# Patient Record
Sex: Female | Born: 1985 | Race: Black or African American | Hispanic: No | Marital: Married | Smoking: Former smoker
Health system: Southern US, Community
[De-identification: ages and names within clinical notes are randomized; demographics above are authoritative.]

## PROBLEM LIST (undated history)

## (undated) ENCOUNTER — Inpatient Hospital Stay (HOSPITAL_COMMUNITY): Payer: Self-pay

## (undated) DIAGNOSIS — J020 Streptococcal pharyngitis: Secondary | ICD-10-CM

## (undated) DIAGNOSIS — R110 Nausea: Secondary | ICD-10-CM

## (undated) DIAGNOSIS — D649 Anemia, unspecified: Secondary | ICD-10-CM

## (undated) DIAGNOSIS — J039 Acute tonsillitis, unspecified: Secondary | ICD-10-CM

## (undated) DIAGNOSIS — Z332 Encounter for elective termination of pregnancy: Secondary | ICD-10-CM

## (undated) HISTORY — DX: Anemia, unspecified: D64.9

## (undated) HISTORY — DX: Nausea: R11.0

## (undated) HISTORY — DX: Streptococcal pharyngitis: J02.0

## (undated) HISTORY — PX: MOUTH SURGERY: SHX715

## (undated) HISTORY — DX: Acute tonsillitis, unspecified: J03.90

## (undated) HISTORY — DX: Encounter for elective termination of pregnancy: Z33.2

---
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
## 2005-02-13 ENCOUNTER — Ambulatory Visit: Payer: Self-pay | Admitting: Family Medicine

## 2005-06-05 ENCOUNTER — Ambulatory Visit: Payer: Self-pay | Admitting: Family Medicine

## 2005-08-18 ENCOUNTER — Other Ambulatory Visit: Admission: RE | Admit: 2005-08-18 | Discharge: 2005-08-18 | Payer: Self-pay | Admitting: Family Medicine

## 2005-08-18 ENCOUNTER — Ambulatory Visit: Payer: Self-pay | Admitting: Family Medicine

## 2009-03-06 ENCOUNTER — Encounter: Payer: Self-pay | Admitting: Family Medicine

## 2009-03-17 DIAGNOSIS — J039 Acute tonsillitis, unspecified: Secondary | ICD-10-CM

## 2009-03-17 HISTORY — DX: Acute tonsillitis, unspecified: J03.90

## 2009-10-02 ENCOUNTER — Encounter: Payer: Self-pay | Admitting: Family Medicine

## 2009-11-20 ENCOUNTER — Ambulatory Visit: Payer: Self-pay | Admitting: Family Medicine

## 2009-11-20 LAB — CONVERTED CEMR LAB: Beta hcg, urine, semiquantitative: NEGATIVE

## 2009-11-21 ENCOUNTER — Telehealth: Payer: Self-pay | Admitting: Family Medicine

## 2009-11-21 LAB — CONVERTED CEMR LAB
BUN: 9 mg/dL (ref 6–23)
CO2: 27 meq/L (ref 19–32)
Chloride: 106 meq/L (ref 96–112)
Creatinine, Ser: 0.62 mg/dL (ref 0.40–1.20)
HDL: 43 mg/dL (ref >39)
Hemoglobin: 13.5 g/dL (ref 12.0–15.0)
LDL Cholesterol: 85 mg/dL (ref 0–99)
Lymphocytes Relative: 22 % (ref 12–46)
Monocytes Absolute: 0.9 10*3/uL (ref 0.1–1.0)
Monocytes Relative: 6 % (ref 3–12)
Neutro Abs: 10.9 10*3/uL — ABNORMAL HIGH (ref 1.7–7.7)
RBC: 4.46 M/uL (ref 3.87–5.11)
TSH: 0.388 microintl units/mL (ref 0.350–4.500)
VLDL: 15 mg/dL (ref 0–40)
WBC: 15.3 10*3/uL — ABNORMAL HIGH (ref 4.0–10.5)

## 2010-01-08 ENCOUNTER — Ambulatory Visit: Payer: Self-pay | Admitting: Family Medicine

## 2010-01-08 ENCOUNTER — Other Ambulatory Visit: Admission: RE | Admit: 2010-01-08 | Discharge: 2010-01-08 | Payer: Self-pay | Admitting: Family Medicine

## 2010-01-08 DIAGNOSIS — N760 Acute vaginitis: Secondary | ICD-10-CM | POA: Insufficient documentation

## 2010-01-08 LAB — HM PAP SMEAR

## 2010-01-09 LAB — CONVERTED CEMR LAB
Basophils Relative: 0 % (ref 0–1)
MCHC: 33.4 g/dL (ref 30.0–36.0)
Monocytes Relative: 7 % (ref 3–12)
Neutro Abs: 11.1 10*3/uL — ABNORMAL HIGH (ref 1.7–7.7)
Neutrophils Relative %: 66 % (ref 43–77)
Platelets: 297 10*3/uL (ref 150–400)
RBC: 4.21 M/uL (ref 3.87–5.11)
WBC: 16.8 10*3/uL — ABNORMAL HIGH (ref 4.0–10.5)

## 2010-01-10 ENCOUNTER — Encounter: Payer: Self-pay | Admitting: Family Medicine

## 2010-01-11 ENCOUNTER — Ambulatory Visit (HOSPITAL_COMMUNITY)
Admission: RE | Admit: 2010-01-11 | Discharge: 2010-01-11 | Payer: Self-pay | Source: Home / Self Care | Admitting: Family Medicine

## 2010-01-11 ENCOUNTER — Encounter: Payer: Self-pay | Admitting: Physician Assistant

## 2010-01-11 LAB — CONVERTED CEMR LAB
Candida species: NEGATIVE
Chlamydia, DNA Probe: POSITIVE — AB
Gardnerella vaginalis: POSITIVE — AB

## 2010-01-18 ENCOUNTER — Telehealth: Payer: Self-pay | Admitting: Family Medicine

## 2010-02-01 ENCOUNTER — Encounter: Payer: Self-pay | Admitting: Family Medicine

## 2010-02-03 ENCOUNTER — Encounter: Payer: Self-pay | Admitting: Family Medicine

## 2010-02-04 ENCOUNTER — Encounter: Payer: Self-pay | Admitting: Family Medicine

## 2010-02-05 LAB — CONVERTED CEMR LAB
Basophils Absolute: 0.1 10*3/uL (ref 0.0–0.1)
Basophils Relative: 0 % (ref 0–1)
Eosinophils Absolute: 0.3 10*3/uL (ref 0.0–0.7)
Eosinophils Relative: 2 % (ref 0–5)
HCT: 39.7 % (ref 36.0–46.0)
MCHC: 34.8 g/dL (ref 30.0–36.0)
MCV: 88.8 fL (ref 78.0–100.0)
Neutrophils Relative %: 61 % (ref 43–77)
Platelets: 317 10*3/uL (ref 150–400)
RDW: 13.8 % (ref 11.5–15.5)
WBC: 16.7 10*3/uL — ABNORMAL HIGH (ref 4.0–10.5)

## 2010-02-25 ENCOUNTER — Encounter: Payer: Self-pay | Admitting: Family Medicine

## 2010-03-01 ENCOUNTER — Ambulatory Visit (HOSPITAL_COMMUNITY): Payer: Self-pay | Admitting: Oncology

## 2010-03-01 ENCOUNTER — Encounter (HOSPITAL_COMMUNITY)
Admission: RE | Admit: 2010-03-01 | Discharge: 2010-03-31 | Payer: Self-pay | Source: Home / Self Care | Attending: Oncology | Admitting: Oncology

## 2010-03-01 ENCOUNTER — Encounter: Payer: Self-pay | Admitting: Family Medicine

## 2010-03-04 ENCOUNTER — Ambulatory Visit (HOSPITAL_COMMUNITY): Payer: Self-pay | Admitting: Oncology

## 2010-03-05 ENCOUNTER — Encounter (HOSPITAL_COMMUNITY)
Admission: RE | Admit: 2010-03-05 | Discharge: 2010-04-04 | Payer: Self-pay | Source: Home / Self Care | Attending: Oncology | Admitting: Oncology

## 2010-03-13 ENCOUNTER — Encounter: Payer: Self-pay | Admitting: Family Medicine

## 2010-04-16 NOTE — Miscellaneous (Signed)
  Clinical Lists Changes  Orders: Added new Referral order of Hematology Referral (Hematology) - Signed

## 2010-04-16 NOTE — Progress Notes (Signed)
Summary: dr.teoh  dr.teoh   Imported By: Lind Guest 10/09/2009 14:09:58  _____________________________________________________________________  External Attachment:    Type:   Image     Comment:   External Document

## 2010-04-16 NOTE — Letter (Signed)
Summary: TO DR Jerelyn Scott  TO DR Jerelyn Scott   Imported By: Lind Guest 02/05/2010 08:29:31  _____________________________________________________________________  External Attachment:    Type:   Image     Comment:   External Document

## 2010-04-16 NOTE — Letter (Signed)
Summary: Laboratory/X-Ray Results  [facility]  [address]   [place], [place] [address]   Phone: [phone]  Fax: [phone]    Lab/X-Ray Results  January 11, 2010  MRN: [id number]  [facility] [address] [place], [place]  [address]    The results of your recent lab/x-ray has been reviewed and were found: ABNORMAL Please contact our office upon receiving this notification.         Adella Hare LPN

## 2010-04-16 NOTE — Assessment & Plan Note (Signed)
Summary: physical   Vital Signs:  Patient profile:   25 year old female Menstrual status:  irregular Height:      63.5 inches Weight:      141 pounds BMI:     24.67 Pulse rate:   84 / minute Pulse rhythm:   irregular Resp:     16 per minute BP sitting:   108 / 58  (left arm)  Vitals Entered By: Mauricia Area CMA (January 08, 2010 3:28 PM) CC: physical   CC:  physical.  History of Present Illness: Localised left lower quad pain x 2weeks. Excessively heavy bleeding last  week, flooding and clotting, first time ever, needs ocp, bleeding has been irregular  i recentr times Reports  that she has otherwise been  doing well. Denies recent fever or chills. Denies sinus pressure, nasal congestion , ear pain or sore throat. Denies chest congestion, or cough productive of sputum. Denies chest pain, palpitations, PND, orthopnea or leg swelling. Denies abdominal pain, nausea, vomitting, diarrhea or constipation. Denies change in bowel movements or bloody stool. Denies dysuria , frequency, incontinence or hesitancy. Denies  joint pain, swelling, or reduced mobility. Denies headaches, vertigo, seizures. Denies depression, anxiety or insomnia. Denies  rash, lesions, or itch.     Current Medications (verified): 1)  None  Allergies (verified): No Known Drug Allergies  Review of Systems      See HPI General:  Complains of fatigue. Eyes:  Denies blurring and discharge. GU:  Complains of abnormal vaginal bleeding. Endo:  Denies cold intolerance, excessive thirst, excessive urination, and heat intolerance. Heme:  Complains of bleeding; denies abnormal bruising. Allergy:  Denies hives or rash and itching eyes.  Physical Exam  General:  Well-developed,well-nourished,in no acute distress; alert,appropriate and cooperative throughout examination Head:  Normocephalic and atraumatic without obvious abnormalities. No apparent alopecia or balding. Eyes:  No corneal or conjunctival  inflammation noted. EOMI. Perrla. Funduscopic exam benign, without hemorrhages, exudates or papilledema. Vision grossly normal. Ears:  External ear exam shows no significant lesions or deformities.  Otoscopic examination reveals clear canals, tympanic membranes are intact bilaterally without bulging, retraction, inflammation or discharge. Hearing is grossly normal bilaterally. Nose:  External nasal examination shows no deformity or inflammation. Nasal mucosa are pink and moist without lesions or exudates. Mouth:  Oral mucosa and oropharynx without lesions or exudates.  Teeth in good repair. Neck:  No deformities, masses, or tenderness noted. Chest Wall:  No deformities, masses, or tenderness noted. Breasts:  No mass, nodules, thickening, tenderness, bulging, retraction, inflamation, nipple discharge or skin changes noted.   Lungs:  Normal respiratory effort, chest expands symmetrically. Lungs are clear to auscultation, no crackles or wheezes. Heart:  Normal rate and regular rhythm. S1 and S2 normal without gallop, murmur, click, rub or other extra sounds. Abdomen:  Bowel sounds positive,abdomen soft and non-tender without masses, organomegaly or hernias noted. Genitalia:  Normal introitus for age, no external lesions,malodoros vaginal discharge, mucosa pink and moist, no vaginal or cervical lesions, no vaginal atrophy, no friaility or hemorrhage, normal uterus size and positionleft adnexal mass and tenderness Msk:  No deformity or scoliosis noted of thoracic or lumbar spine.   Pulses:  R and L carotid,radial,femoral,dorsalis pedis and posterior tibial pulses are full and equal bilaterally Extremities:  No clubbing, cyanosis, edema, or deformity noted with normal full range of motion of all joints.   Neurologic:  No cranial nerve deficits noted. Station and gait are normal. Plantar reflexes are down-going bilaterally. DTRs are symmetrical throughout. Sensory,  motor and coordinative functions appear  intact. Skin:  Intact without suspicious lesions or rashes Cervical Nodes:  No lymphadenopathy noted Axillary Nodes:  No palpable lymphadenopathy Inguinal Nodes:  No significant adenopathy Psych:  Cognition and judgment appear intact. Alert and cooperative with normal attention span and concentration. No apparent delusions, illusions, hallucinations   Impression & Recommendations:  Problem # 1:  PELVIC MASS (ICD-789.30) Assessment Comment Only  Orders: Radiology Referral (Radiology)  Problem # 2:  MENORRHAGIA (ICD-626.2) Assessment: Comment Only  The following medications were removed from the medication list:    Provera 5 Mg Tabs (Medroxyprogesterone acetate) .Marland Kitchen... Take 1 tablet by mouth two times a day , start 11/21/2009    Ortho Tri-cyclen (28) 0.18/0.215/0.25 Mg-35 Mcg Tabs (Norgestim-eth estrad triphasic) ..... Use as directed , start on the 5th dayafter you start bleeding Her updated medication list for this problem includes:    Ortho Tri-cyclen (28) 0.18/0.215/0.25 Mg-35 Mcg Tabs (Norgestim-eth estrad triphasic) .Marland Kitchen... 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 Take 1 tablet by mouth three times a day Her updated medication list for this problem includes:    Doxycycline Hyclate 100 Mg Caps (Doxycycline hyclate) .Marland Kitchen... Take 1 capsule by mouth two times a day    Metronidazole 500 Mg Tabs (Metronidazole) .Marland Kitchen... Take 1 tablet by mouth two times a day  Orders: T-Wet Prep by Molecular Probe [phone]) T-Chlamydia & GC Probe, Genital ([id number]/[id number]) T-Pap Smear, Thin Prep (78469)  Problem # 5:  AMENORRHEA  (ICD-626.0) Assessment: Comment Only  The following medications were removed from the medication list:    Provera 5 Mg Tabs (Medroxyprogesterone acetate) .Marland Kitchen... Take 1 tablet by mouth two times a day , start 11/21/2009    Ortho Tri-cyclen (28) 0.18/0.215/0.25 Mg-35 Mcg Tabs (Norgestim-eth estrad triphasic) ..... Use as directed , start on the 5th dayafter you start bleeding Her updated medication list for this problem includes:    Ortho Tri-cyclen (28) 0.18/0.215/0.25 Mg-35 Mcg Tabs (Norgestim-eth estrad triphasic) .Marland Kitchen... Take one tablet every day  Orders: Urine Pregnancy Test  (62952)  negative  Complete Medication List: 1)  Ortho Tri-cyclen (28) 0.18/0.215/0.25 Mg-35 Mcg Tabs (Norgestim-eth estrad triphasic) .... Take one tablet every day 2)  Doxycycline Hyclate 100 Mg Caps (Doxycycline hyclate) .... Take 1 capsule by mouth two times a day 3)  Metronidazole 500 Mg Tabs (Metronidazole) .... Take 1 tablet by mouth two times a day  Other Orders: T-CBC w/Diff ([id number])  Patient Instructions: 1)  Please schedule a follow-up appointment in 4 months. 2)  It is important that you exercise regularly at least 20 minutes 5 times a week. If you develop chest pain, have severe difficulty breathing, or feel very tired , stop exercising immediately and seek medical attention. 3)  CBC today, re eval leukocytosis. 4)  Pelvoc [place] to eval heavy menses and pelvic pain.Marland Kitchen 5)  If your WBC is still high you will be referred tohematology, Dr Jerelyn Scott 6)  For the birth control pills, pls take 2 tablets today and 2 tomorrow, then start one dailyb after that, usecondoms this month as well. 7)  We will arrange a pelvic US asap. and also do a urine pregnanacy test Prescriptions:  METRONIDAZOLE 500 MG TABS (METRONIDAZOLE) Take 1 tablet by mouth two times a day  #14 x 0   Entered and Authorized by:   Syliva Overman MD   Signed by:   Syliva Overman MD on 01/11/2010   Method used:   Historical   RxID:    [id number] DOXYCYCLINE HYCLATE 100 MG CAPS (DOXYCYCLINE HYCLATE) Take 1 capsule by mouth two times a day  #14 x 0   Entered and Authorized by:   Syliva Overman MD   Signed by:   Syliva Overman MD on 01/11/2010   Method used:   Historical   RxID:   [id number] ORTHO TRI-CYCLEN (28) 0.18/0.215/0.25 MG-35 MCG TABS (NORGESTIM-ETH ESTRAD TRIPHASIC) take one tablet every day  #1 x 11   Entered and Authorized by:   Syliva Overman MD   Signed by:   Syliva Overman MD on 01/08/2010   Method used:   Print then Give to Patient   RxID:   [id number]    Orders Added: 1)  Est. Patient 18-39 years [99395] 2)  T-CBC w/Diff [[id number]] 3)  Radiology Referral [Radiology] 4)  Radiology Referral [Radiology] 5)  T-Wet Prep by Molecular Probe [[id number]] 6)  T-Chlamydia & GC Probe, Genital [87491/[id number]] 7)  T-Pap Smear, Thin Prep [56387] 8)  Urine Pregnancy Test  [81025]

## 2010-04-16 NOTE — Letter (Signed)
Summary: Letter  Letter   Imported By: Lind Guest 02/01/2010 14:49:07  _____________________________________________________________________  External Attachment:    Type:   Image     Comment:   External Document

## 2010-04-16 NOTE — Assessment & Plan Note (Signed)
Summary: f up   Vital Signs:  Patient profile:   25 year old female Menstrual status:  irregular LMP:     08/15/2009 Height:      63.5 inches Weight:      140 pounds BMI:     24.50 Pulse rate:   74 / minute Pulse rhythm:   regular Resp:     16 per minute BP sitting:   122 / 82  (left arm) Cuff size:   regular  Vitals Entered By: Everitt Amber LPN (November 20, 2009 3:28 PM) CC: New Patient (been here before but its been awhile) LMP (date): 08/15/2009     Menstrual Status irregular Enter LMP: 08/15/2009   CC:  New Patient (been here before but its been awhile).  History of Present Illness: Reports  that she has been doing well. Her main concern is whethter she may be pregnant. She has not had a regular bleed for over 6 weeks , is on no contraception, wants to keep baby. Denies recent fever or chills. Denies sinus pressure, nasal congestion , ear pain or sore throat. Denies chest congestion, or cough productive of sputum. Denies chest pain, palpitations, PND, orthopnea or leg swelling. Denies abdominal pain, nausea, vomitting, diarrhea or constipation. Denies change in bowel movements or bloody stool. Denies dysuria , frequency, incontinence or hesitancy. Denies  joint pain, swelling, or reduced mobility. Denies headaches, vertigo, seizures. Denies depression, anxiety or insomnia. Denies  rash, lesions, or itch.     Preventive Screening-Counseling & Management  Alcohol-Tobacco     Smoking Status: never  Caffeine-Diet-Exercise     Does Patient Exercise: no  Comments: occasionally      Drug Use:  no.    Current Medications (verified): 1)  None  Allergies (verified): No Known Drug Allergies  Past History:  Past Medical History: healthy and on no meds elective aboortion at 18 weeks in 2004 recurrent strep throat in the pasyt year approx 3 times mild nausea in the past several weeks hospitalised overnight in [place] in 2011 for severe tonsillitis  Past  Surgical History: abortion in 18 weeks in 2004  tonsillectomy to be scheduled in the near future  Family History: Mother-living-Bone marrow cancer father-unknown only child  Social History: Occupation: Medical sales representative Single Alcohol use-yes occasionally Drug use-no Regular exercise-no Never Smoked Smoking Status:  never Drug Use:  no Does Patient Exercise:  no  Review of Systems      See HPI General:  Complains of fatigue. Eyes:  Denies blurring and discharge. GU:  Complains of abnormal vaginal bleeding; spotting since June. Endo:  Denies cold intolerance, excessive thirst, and excessive urination. Heme:  Denies abnormal bruising and bleeding. Allergy:  Denies hives or rash and itching eyes.  Physical Exam  General:  Well-developed,well-nourished,in no acute distress; alert,appropriate and cooperative throughout examination HEENT: No facial asymmetry,  EOMI, No sinus tenderness, TM's Clear, oropharynx  pink and moist.   Chest: Clear to auscultation bilaterally.  CVS: S1, S2, No murmurs, No S3.   Abd: Soft, Nontender.  MS: Adequate ROM spine, hips, shoulders and knees.  Ext: No edema.   CNS: CN 2-12 intact, power tone and sensation normal throughout.   Skin: Intact, no visible lesions or rashes.  Psych: Good eye contact, normal affect.  Memory intact, not anxious or depressed appearing.    Impression & Recommendations:  Problem # 1:  FATIGUE (ICD-780.79) Assessment Comment Only  Orders: T-Basic Metabolic Panel [phone]) T-TSH [phone]) T-CBC w/Diff [phone])  Problem # 2:  AMENORRHEA (ICD-626.0) Assessment: Comment Only  Her updated medication list for this problem includes:    Provera 5 Mg Tabs (Medroxyprogesterone acetate) .Marland Kitchen... Take 1 tablet by mouth two times a day , start 11/21/2009    Ortho Tri-cyclen (28) 0.18/0.215/0.25 Mg-35 Mcg Tabs (Norgestim-eth estrad triphasic) ..... Use as directed , start on the 5th dayafter you start  bleeding  Orders: Urine Pregnancy Test  (81025)negative B-HCG Quant-[facility] ([id number])  Complete Medication List: 1)  Provera 5 Mg Tabs (Medroxyprogesterone acetate) .... Take 1 tablet by mouth two times a day , start 11/21/2009 2)  Ortho Tri-cyclen (28) 0.18/0.215/0.25 Mg-35 Mcg Tabs (Norgestim-eth estrad triphasic) .... Use as directed , start on the 5th dayafter you start bleeding 3)  Penicillin V Potassium 500 Mg Tabs (Penicillin v potassium) .... Take 1 tablet by mouth three times a day  Other Orders: T-Lipid Profile ([id number]) Tdap => 71yrs IM (29562) Admin 1st Vaccine (13086) Admin 1st Vaccine [facility]) [phone])  Patient Instructions: 1)  cPE in 6 to 7 weeks. 2)  BMP prior to visit, ICD-9: 3)  Lipid Panel prior to visit, ICD-9: 4)  TSH prior to visit, ICD-9:   today 5)  CBC w/ Diff prior to visit, ICD-9: 6)  Quaant HCG 7)  TdAP today. 8)  PLS call tomorrow for your blood test result, if you are not pregnant i will send in meds to start a bleed, to be followed by contraceptive pills the 4th day of your bleed. 9)  If you could be exposed to sexually transmitted diseases, you should use a condom. 10)  If you are having sex and you or your partner don't want a child, use contraception. Prescriptions: PROVERA 5 MG TABS (MEDROXYPROGESTERONE ACETATE) Take 1 tablet by mouth two times a day , start 11/21/2009  #10 x 0   Entered by:   Everitt Amber LPN   Authorized by:   Syliva Overman MD   Signed by:   Everitt Amber LPN on 62/95/2841   Method used:   Printed then faxed to ...         RxID:   [id number] PENICILLIN V POTASSIUM 500 MG TABS (PENICILLIN V POTASSIUM) Take 1 tablet by mouth three times a day  #21 x 0   Entered and Authorized by:   Syliva Overman MD   Signed by:   Syliva Overman MD on 11/21/2009   Method used:   Printed then faxed to ...         RxID:   [id number] ORTHO TRI-CYCLEN (28) 0.18/0.215/0.25 MG-35 MCG TABS (NORGESTIM-ETH ESTRAD TRIPHASIC) Use as  directed , start on the 5th dayafter you start bleeding  #1 x 1   Entered and Authorized by:   Syliva Overman MD   Signed by:   Syliva Overman MD on 11/21/2009   Method used:   Printed then faxed to ...         RxID:   [id number] PROVERA 5 MG TABS (MEDROXYPROGESTERONE ACETATE) Take 1 tablet by mouth two times a day , start 11/21/2009  #10 x 0   Entered and Authorized by:   Syliva Overman MD   Signed by:   Syliva Overman MD on 11/21/2009   Method used:   Printed then faxed to ...         RxID:   [id number]    Laboratory Results   Urine Tests      Urine HCG: negative      Tetanus/Td Vaccine    Vaccine Type: Tdap  Site: left deltoid    Mfr: boostrix    Dose: 0.5 ml    Route: IM    Given by: Everitt Amber LPN    Exp. Date: 07/2010    Lot #: [id number]

## 2010-04-16 NOTE — Progress Notes (Signed)
  Phone Note Call from Patient   Caller: Patient Summary of Call: patient states she is concerned the abx are gonna work because she is throwing them up, states she takes with food and water but still gets sick and cant hold them down. metrondazole Initial call taken by: Adella Hare LPN,  January 18, 2010 2:10 PM  Follow-up for Phone Call        [facility] ERX AND ADVISE PHENERGAN 12.5MG  .BIDTAB as needed #12 ONLY Follow-up by: Syliva Overman MD,  January 18, 2010 2:53 PM  Additional Follow-up for Phone Call Additional follow up Details #1::        Phone Call Completed, Rx Called In Additional Follow-up by: Adella Hare LPN,  January 18, 2010 3:24 PM    New/Updated Medications: PROMETHAZINE HCL 12.5 MG TABS (PROMETHAZINE HCL) one tab by mouth two times a day as needed Prescriptions: PROMETHAZINE HCL 12.5 MG TABS (PROMETHAZINE HCL) one tab by mouth two times a day as needed  #12 x 0   Entered by:   Adella Hare LPN   Authorized by:   Syliva Overman MD   Signed by:   Adella Hare LPN on 16/12/9602   Method used:   Electronically to        [facility]* (retail)       [address]       [place], [place]  [address]       Ph: [phone] or [phone]       Fax: [phone]   RxID:   [id number]   Appended Document:  IT iS aBSOLUTELY VITAL this pt be contacted today, either by phone or mail, she nEEDS a rept CBC done no later than 02/04/2010, she may need a referral to oncolgy, her mother has leukemia and her WBC has been high, pls doument what you complete today on her  Appended Document:  called patient, left message, mailed letter  Appended Document: lab order order sent, patient aware

## 2010-04-16 NOTE — Progress Notes (Signed)
Summary: med fax to [place]  Phone Note Call from Patient   Summary of Call: pt needs meds sent to [place] [facility]. [phone]  Initial call taken by: Rudene Anda,  November 21, 2009 2:00 PM    Prescriptions: PENICILLIN V POTASSIUM 500 MG TABS (PENICILLIN V POTASSIUM) Take 1 tablet by mouth three times a day  #21 x 0   Entered by:   Everitt Anvika Gashi LPN   Authorized by:   Syliva Overman MD   Signed by:   Everitt Bryley Chrisman LPN on 28/41/3244   Method used:   Electronically to        [facility] [phone] * (retail)       [address]       [place], [place]  [address]       Ph: [phone] or [phone]       Fax: [phone]   RxID:   [id number] ORTHO TRI-CYCLEN (28) 0.18/0.215/0.25 MG-35 MCG TABS (NORGESTIM-ETH ESTRAD TRIPHASIC) Use as directed , start on the 5th dayafter you start bleeding  #1 x 1   Entered by:   Everitt Kynesha Guerin LPN   Authorized by:   Syliva Overman MD   Signed by:   Everitt Zafirah Vanzee LPN on 73/22/0254   Method used:   Electronically to        [facility] [phone] * (retail)       [address]       [place], [place]  [address]       Ph: [phone] or [phone]       Fax: [phone]   RxID:   [id number] PROVERA 5 MG TABS (MEDROXYPROGESTERONE ACETATE) Take 1 tablet by mouth two times a day , start 11/21/2009  #10 x 0   Entered by:   Everitt Mar Zettler LPN   Authorized by:   Syliva Overman MD   Signed by:   Everitt Markeis Allman LPN on 89/38/1017   Method used:   Electronically to        [facility]  [phone] * (retail)       [address]       [place], [place]  [address]       Ph: [phone] or [phone]       Fax: [phone]   RxID:   [id number]

## 2010-04-18 NOTE — Letter (Signed)
Summary: [facility]  [facility]   Imported By: Lind Guest 03/28/2010 15:41:50  _____________________________________________________________________  External Attachment:    Type:   Image     Comment:   External Document

## 2010-04-18 NOTE — Letter (Signed)
Summary: envelope returned  envelope returned   Imported By: Lind Guest 02/25/2010 16:47:05  _____________________________________________________________________  External Attachment:    Type:   Image     Comment:   External Document

## 2010-04-18 NOTE — Letter (Signed)
Summary: [facility]  [facility]   Imported By: Lind Guest 03/28/2010 15:41:11  _____________________________________________________________________  External Attachment:    Type:   Image     Comment:   External Document

## 2010-05-08 ENCOUNTER — Other Ambulatory Visit (HOSPITAL_COMMUNITY): Payer: Self-pay

## 2010-05-08 ENCOUNTER — Ambulatory Visit (HOSPITAL_COMMUNITY): Payer: Self-pay

## 2010-05-10 ENCOUNTER — Ambulatory Visit (HOSPITAL_COMMUNITY): Payer: Self-pay | Admitting: Oncology

## 2010-05-13 ENCOUNTER — Ambulatory Visit: Payer: Self-pay | Admitting: Family Medicine

## 2010-05-13 ENCOUNTER — Encounter: Payer: Self-pay | Admitting: Family Medicine

## 2010-05-23 NOTE — Letter (Signed)
Summary: 1st no show   1st no show   Imported By: Lind Guest 05/14/2010 09:18:22  _____________________________________________________________________  External Attachment:    Type:   Image     Comment:   External Document

## 2010-05-27 LAB — CBC
HCT: 38 % (ref 36.0–46.0)
Hemoglobin: 13.4 g/dL (ref 12.0–15.0)
MCHC: 35.3 g/dL (ref 30.0–36.0)
MCV: 88 fL (ref 78.0–100.0)
RDW: 13.5 % (ref 11.5–15.5)

## 2010-05-27 LAB — COMPREHENSIVE METABOLIC PANEL
ALT: 32 U/L (ref 0–35)
Alkaline Phosphatase: 43 U/L (ref 39–117)
BUN: 10 mg/dL (ref 6–23)
CO2: 30 mEq/L (ref 19–32)
Calcium: 9.3 mg/dL (ref 8.4–10.5)
GFR calc non Af Amer: >60 mL/min (ref >60)
Glucose, Bld: 82 mg/dL (ref 70–99)
Potassium: 4.3 mEq/L (ref 3.5–5.1)
Sodium: 139 mEq/L (ref 135–145)
Total Protein: 7.7 g/dL (ref 6.0–8.3)

## 2010-05-27 LAB — SEDIMENTATION RATE: Sed Rate: 12 mm/hr (ref 0–22)

## 2010-05-27 LAB — C-REACTIVE PROTEIN: CRP: 0.1 mg/dL — ABNORMAL LOW (ref <0.6)

## 2010-05-27 LAB — URINALYSIS, MICROSCOPIC ONLY
Bilirubin Urine: NEGATIVE
Hgb urine dipstick: NEGATIVE
Ketones, ur: NEGATIVE mg/dL
Nitrite: NEGATIVE
Urobilinogen, UA: 0.2 mg/dL (ref 0.0–1.0)

## 2010-05-27 LAB — DIFFERENTIAL
Basophils Absolute: 0 10*3/uL (ref 0.0–0.1)
Eosinophils Absolute: 0.3 10*3/uL (ref 0.0–0.7)
Eosinophils Relative: 3 % (ref 0–5)
Lymphs Abs: 3.5 10*3/uL (ref 0.7–4.0)
Neutrophils Relative %: 54 % (ref 43–77)

## 2010-05-27 LAB — PREGNANCY, URINE: Preg Test, Ur: NEGATIVE

## 2010-05-27 LAB — HEPATITIS PANEL, ACUTE: Hep B C IgM: NEGATIVE

## 2010-05-28 ENCOUNTER — Emergency Department (HOSPITAL_COMMUNITY)
Admission: EM | Admit: 2010-05-28 | Discharge: 2010-05-29 | Disposition: A | Discharge status: Home / Self Care | Payer: Self-pay | Attending: Emergency Medicine | Admitting: Emergency Medicine

## 2010-05-28 DIAGNOSIS — O039 Complete or unspecified spontaneous abortion without complication: Secondary | ICD-10-CM | POA: Insufficient documentation

## 2010-05-29 ENCOUNTER — Emergency Department (HOSPITAL_COMMUNITY): Payer: Self-pay

## 2010-05-29 LAB — CBC
HCT: 38.7 % (ref 36.0–46.0)
Hemoglobin: 12.8 g/dL (ref 12.0–15.0)
MCH: 29.8 pg (ref 26.0–34.0)
MCV: 90 fL (ref 78.0–100.0)
Platelets: 312 10*3/uL (ref 150–400)
RBC: 4.3 MIL/uL (ref 3.87–5.11)
WBC: 15.6 10*3/uL — ABNORMAL HIGH (ref 4.0–10.5)

## 2010-05-29 LAB — WET PREP, GENITAL
WBC, Wet Prep HPF POC: NONE SEEN
Yeast Wet Prep HPF POC: NONE SEEN

## 2010-05-29 LAB — URINE MICROSCOPIC-ADD ON

## 2010-05-29 LAB — DIFFERENTIAL
Basophils Relative: 0 % (ref 0–1)
Eosinophils Relative: 2 % (ref 0–5)
Lymphocytes Relative: 27 % (ref 12–46)
Monocytes Relative: 7 % (ref 3–12)
Neutro Abs: 10 10*3/uL — ABNORMAL HIGH (ref 1.7–7.7)
Neutrophils Relative %: 64 % (ref 43–77)

## 2010-05-29 LAB — URINALYSIS, ROUTINE W REFLEX MICROSCOPIC
Glucose, UA: NEGATIVE mg/dL
Ketones, ur: NEGATIVE mg/dL
Leukocytes, UA: NEGATIVE
Nitrite: NEGATIVE
Specific Gravity, Urine: 1.026 (ref 1.005–1.030)
pH: 6 (ref 5.0–8.0)

## 2010-05-29 LAB — BASIC METABOLIC PANEL
BUN: 9 mg/dL (ref 6–23)
CO2: 27 mEq/L (ref 19–32)
Calcium: 9.2 mg/dL (ref 8.4–10.5)
Creatinine, Ser: 0.82 mg/dL (ref 0.4–1.2)
GFR calc Af Amer: >60 mL/min (ref >60)

## 2010-12-28 ENCOUNTER — Inpatient Hospital Stay (INDEPENDENT_AMBULATORY_CARE_PROVIDER_SITE_OTHER)
Admission: RE | Admit: 2010-12-28 | Discharge: 2010-12-28 | Disposition: A | Discharge status: Home / Self Care | Payer: Self-pay | Source: Ambulatory Visit | Attending: Family Medicine | Admitting: Family Medicine

## 2010-12-28 DIAGNOSIS — A64 Unspecified sexually transmitted disease: Secondary | ICD-10-CM

## 2010-12-28 LAB — POCT PREGNANCY, URINE: Preg Test, Ur: NEGATIVE

## 2010-12-28 LAB — POCT URINALYSIS DIP (DEVICE)
Ketones, ur: NEGATIVE mg/dL
Protein, ur: NEGATIVE mg/dL
Specific Gravity, Urine: 1.01 (ref 1.005–1.030)
Urobilinogen, UA: 0.2 mg/dL (ref 0.0–1.0)
pH: 7 (ref 5.0–8.0)

## 2010-12-28 LAB — WET PREP, GENITAL

## 2010-12-31 LAB — GC/CHLAMYDIA PROBE AMP, GENITAL: GC Probe Amp, Genital: POSITIVE — AB

## 2011-03-06 ENCOUNTER — Encounter (HOSPITAL_COMMUNITY): Payer: Self-pay | Admitting: *Deleted

## 2011-03-06 ENCOUNTER — Inpatient Hospital Stay (HOSPITAL_COMMUNITY)
Admission: AD | Admit: 2011-03-06 | Discharge: 2011-03-07 | Disposition: A | Discharge status: Home / Self Care | Payer: Medicaid Other | Source: Ambulatory Visit | Attending: Obstetrics & Gynecology | Admitting: Obstetrics & Gynecology

## 2011-03-06 DIAGNOSIS — R109 Unspecified abdominal pain: Secondary | ICD-10-CM | POA: Insufficient documentation

## 2011-03-06 DIAGNOSIS — O2390 Unspecified genitourinary tract infection in pregnancy, unspecified trimester: Secondary | ICD-10-CM | POA: Insufficient documentation

## 2011-03-06 DIAGNOSIS — B9689 Other specified bacterial agents as the cause of diseases classified elsewhere: Secondary | ICD-10-CM | POA: Insufficient documentation

## 2011-03-06 DIAGNOSIS — N760 Acute vaginitis: Secondary | ICD-10-CM

## 2011-03-06 DIAGNOSIS — Z3490 Encounter for supervision of normal pregnancy, unspecified, unspecified trimester: Secondary | ICD-10-CM

## 2011-03-06 DIAGNOSIS — A499 Bacterial infection, unspecified: Secondary | ICD-10-CM

## 2011-03-06 NOTE — ED Provider Notes (Signed)
History     No chief complaint on file.  HPI 25 y.o. G3P0020 at Unknown EGA. C/O cramping starting today, no bleeding. + UPT last week. Had normal period on 10/3, then spotting in early November, no period in December.    Past Medical History  Diagnosis Date  . Abortion, nontherapeutic 2004    Elective at 18 weeks   . Strep throat     recurrent approx. 3 times in the past year   . Nausea     mild in the pastt several weeks   . Tonsillitis 2011    Severe - hosptalised in overnight in [place]     Past Surgical History  Procedure Date  . Induced abortion 2004    at 18 weeks (elective )  . Tonsillectomy     to be sscheduled in the near future     Family History  Problem Relation Age of Onset  . Cancer Mother     bone marrow     History  Substance Use Topics  . Smoking status: Never Smoker   . Smokeless tobacco: Not on file  . Alcohol Use: Yes    Allergies: Allergies not on file  Prescriptions prior to admission  Medication Sig Dispense Refill  . doxycycline (VIBRAMYCIN) 100 MG capsule Take 100 mg by mouth 2 (two) times daily. Take one capsule by mouth two times a day       . metroNIDAZOLE (FLAGYL) 500 MG tablet Take 500 mg by mouth 2 (two) times daily. Take  one tablet by mouth two times a day       . Norgestim-Eth Estrad Triphasic (ORTHO TRI-CYCLEN, 28,) 0.18/0.215/0.25 MG-35 MCG TABS Take by mouth as directed. Take one tablet by mouth every day       . promethazine (PHENERGAN) 12.5 MG tablet Take 12.5 mg by mouth 2 (two) times daily. One tab by mouth two times a day as needed         Review of Systems  Constitutional: Negative.   Respiratory: Negative.   Cardiovascular: Negative.   Gastrointestinal: Positive for abdominal pain. Negative for nausea, vomiting, diarrhea and constipation.  Genitourinary: Negative for dysuria, urgency, frequency, hematuria and flank pain.       Negative for vaginal bleeding, vaginal discharge  Musculoskeletal: Negative.     Neurological: Negative.   Psychiatric/Behavioral: Negative.    Physical Exam   Blood pressure 111/57, pulse 71, temperature 98.6 F (37 C), temperature source Oral, resp. rate 20, height 5\' 3"  (1.6 m), weight 150 lb 6 oz (68.21 kg), last menstrual period 12/18/2010, unknown if currently breastfeeding.  Physical Exam  Constitutional: She is oriented to person, place, and time. She appears well-developed and well-nourished. No distress.  HENT:  Head: Normocephalic and atraumatic.  Cardiovascular: Normal rate, regular rhythm and normal heart sounds.   Respiratory: Effort normal and breath sounds normal. No respiratory distress.  GI: Soft. Bowel sounds are normal. She exhibits no distension and no mass. There is no tenderness. There is no rebound and no guarding.  Genitourinary: There is no rash or lesion on the right labia. There is no rash or lesion on the left labia. Uterus is not deviated, not enlarged, not fixed and not tender. Cervix exhibits no motion tenderness, no discharge and no friability. Right adnexum displays no mass, no tenderness and no fullness. Left adnexum displays no mass, no tenderness and no fullness. No erythema, tenderness or bleeding around the vagina. No vaginal discharge found.  Neurological: She is alert and  oriented to person, place, and time.  Skin: Skin is warm and dry.  Psychiatric: She has a normal mood and affect.    MAU Course  Procedures Results for orders placed during the hospital encounter of 03/06/11 (from the past 24 hour(s))  URINALYSIS, ROUTINE W REFLEX MICROSCOPIC     Status: Abnormal   Collection Time   03/06/11 11:24 PM      Component Value Range   Color, Urine YELLOW  YELLOW    APPearance CLEAR  CLEAR    Specific Gravity, Urine 1.010  1.005 - 1.030    pH 6.5  5.0 - 8.0    Glucose, UA NEGATIVE  NEGATIVE (mg/dL)   Hgb urine dipstick NEGATIVE  NEGATIVE    Bilirubin Urine NEGATIVE  NEGATIVE    Ketones, ur NEGATIVE  NEGATIVE (mg/dL)    Protein, ur NEGATIVE  NEGATIVE (mg/dL)   Urobilinogen, UA 0.2  0.0 - 1.0 (mg/dL)   Nitrite NEGATIVE  NEGATIVE    Leukocytes, UA TRACE (*) NEGATIVE   URINE MICROSCOPIC-ADD ON     Status: Abnormal   Collection Time   03/06/11 11:24 PM      Component Value Range   Squamous Epithelial / LPF FEW (*) RARE    WBC, UA 0-2  <3 (WBC/hpf)   Bacteria, UA FEW (*) RARE    Urine-Other MUCOUS PRESENT    POCT PREGNANCY, URINE     Status: Normal   Collection Time   03/07/11 12:17 AM      Component Value Range   Preg Test, Ur POSITIVE    WET PREP, GENITAL     Status: Abnormal   Collection Time   03/07/11 12:28 AM      Component Value Range   Yeast, Wet Prep NONE SEEN  NONE SEEN    Trich, Wet Prep NONE SEEN  NONE SEEN    Clue Cells, Wet Prep MODERATE (*) NONE SEEN    WBC, Wet Prep HPF POC FEW (*) NONE SEEN    US Ob Comp Less 14 Wks  03/07/2011  *RADIOLOGY REPORT*  Clinical Data: Pelvic pain  OBSTETRIC <14 WK [place] AND TRANSVAGINAL OB US  Technique:  Both transabdominal and transvaginal ultrasound examinations were performed for complete evaluation of the gestation as well as the maternal uterus, adnexal regions, and pelvic cul-de-sac.  Transvaginal technique was performed to assess early pregnancy.  Comparison:  None.  Intrauterine gestational sac:  Visualized/normal in shape. Yolk sac: Identified Embryo: Identified Cardiac Activity: Documented Heart Rate: 128 bpm  CRL: 11   mm  7   w  2   d          [place] EDC: 10/22/2011  Maternal uterus/adnexae: No subchorionic hemorrhage.  Normal sonographic appearance to the ovaries.  Corpus luteal cyst noted on the left.  No free fluid.  IMPRESSION: Single intrauterine gestation identified with estimated age of 7 weeks 2 days by crown-rump length.  Cardiac activity documented.  Original Report Authenticated By: Waneta Martins, M.D.   US Ob Transvaginal  03/07/2011  *RADIOLOGY REPORT*  Clinical Data: Pelvic pain  OBSTETRIC <14 WK [place] AND TRANSVAGINAL OB US  Technique:   Both transabdominal and transvaginal ultrasound examinations were performed for complete evaluation of the gestation as well as the maternal uterus, adnexal regions, and pelvic cul-de-sac.  Transvaginal technique was performed to assess early pregnancy.  Comparison:  None.  Intrauterine gestational sac:  Visualized/normal in shape. Yolk sac: Identified Embryo: Identified Cardiac Activity: Documented Heart Rate: 128 bpm  CRL: 11   mm  7   w  2   d          [place] EDC: 10/22/2011  Maternal uterus/adnexae: No subchorionic hemorrhage.  Normal sonographic appearance to the ovaries.  Corpus luteal cyst noted on the left.  No free fluid.  IMPRESSION: Single intrauterine gestation identified with estimated age of 7 weeks 2 days by crown-rump length.  Cardiac activity documented.  Original Report Authenticated By: Waneta Martins, M.D.    Assessment and Plan  25 y.o. G3P0020 at [redacted] weeks EGA BV - rx Flagyl Start prenatal care as soon as possible  Coy Rochford 03/06/2011, 11:29 PM
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
## 2011-03-07 ENCOUNTER — Encounter (HOSPITAL_COMMUNITY): Payer: Self-pay | Admitting: *Deleted

## 2011-03-07 ENCOUNTER — Inpatient Hospital Stay (HOSPITAL_COMMUNITY): Payer: Medicaid Other

## 2011-03-07 LAB — URINALYSIS, ROUTINE W REFLEX MICROSCOPIC
Glucose, UA: NEGATIVE mg/dL
Hgb urine dipstick: NEGATIVE
Ketones, ur: NEGATIVE mg/dL
Protein, ur: NEGATIVE mg/dL
Urobilinogen, UA: 0.2 mg/dL (ref 0.0–1.0)

## 2011-03-07 LAB — WET PREP, GENITAL: Trich, Wet Prep: NONE SEEN

## 2011-03-07 LAB — POCT PREGNANCY, URINE: Preg Test, Ur: POSITIVE

## 2011-03-07 LAB — URINE MICROSCOPIC-ADD ON

## 2011-03-07 LAB — GC/CHLAMYDIA PROBE AMP, GENITAL: GC Probe Amp, Genital: NEGATIVE

## 2011-03-07 MED ORDER — METRONIDAZOLE 500 MG PO TABS: 500.0000 mg | ORAL_TABLET | Freq: Two times a day (BID) | ORAL | Status: AC

## 2011-03-07 NOTE — Progress Notes (Signed)
SSE per CNM.  Wet prep and cultures collected.  VE done.

## 2011-03-07 NOTE — Progress Notes (Signed)
N. Bascom Levels, CNM at bedside.  Assessment done and poc discussed with pt.  Bedside US done per CNM.

## 2011-03-07 NOTE — Progress Notes (Signed)
Notified of wait with [place].  Pt verbalized understanding.

## 2011-03-18 NOTE — ED Provider Notes (Signed)
Agree with above note.  Emma Schlack H. 03/18/2011 9:21 PM  

## 2012-08-04 ENCOUNTER — Emergency Department (HOSPITAL_COMMUNITY): Payer: Medicaid Other

## 2012-08-04 ENCOUNTER — Emergency Department (HOSPITAL_COMMUNITY)
Admission: EM | Admit: 2012-08-04 | Discharge: 2012-08-04 | Disposition: A | Discharge status: Home / Self Care | Payer: Medicaid Other | Attending: Emergency Medicine | Admitting: Emergency Medicine

## 2012-08-04 DIAGNOSIS — M79645 Pain in left finger(s): Secondary | ICD-10-CM

## 2012-08-04 DIAGNOSIS — Y9389 Activity, other specified: Secondary | ICD-10-CM | POA: Insufficient documentation

## 2012-08-04 DIAGNOSIS — Y92410 Unspecified street and highway as the place of occurrence of the external cause: Secondary | ICD-10-CM | POA: Insufficient documentation

## 2012-08-04 DIAGNOSIS — S6990XA Unspecified injury of unspecified wrist, hand and finger(s), initial encounter: Secondary | ICD-10-CM | POA: Insufficient documentation

## 2012-08-04 DIAGNOSIS — Z8709 Personal history of other diseases of the respiratory system: Secondary | ICD-10-CM | POA: Insufficient documentation

## 2012-08-04 DIAGNOSIS — Z8619 Personal history of other infectious and parasitic diseases: Secondary | ICD-10-CM | POA: Insufficient documentation
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
## 2012-08-05 NOTE — ED Provider Notes (Signed)
Medical screening examination/treatment/procedure(s) were performed by non-physician practitioner and as supervising physician I was immediately available for consultation/collaboration.   Richardean Canal, MD 08/05/12 [phone]

## 2012-12-19 IMAGING — US US OB < 14 WEEKS - US OB TV
1 series · 13 of 28 positions shown · non-contrast
Comparison: Pelvic ultrasound performed 01/11/2010, and CT of the
abdomen and pelvis performed 03/05/2010

CLINICAL DATA: Abdominal pain and vaginal bleeding

OBSTETRIC <14 WK US AND TRANSVAGINAL OB US
TECHNIQUE: Both transabdominal and transvaginal ultrasound
examinations were performed for complete evaluation of the
gestation as well as the maternal uterus, adnexal regions, and
pelvic cul-de-sac.  Transvaginal technique was performed to assess
early pregnancy.

[Series 1: us ob < 14 weeks - us ob tv · 0.28mm/px · 13 of 58 slices shown]
[im 3/58]
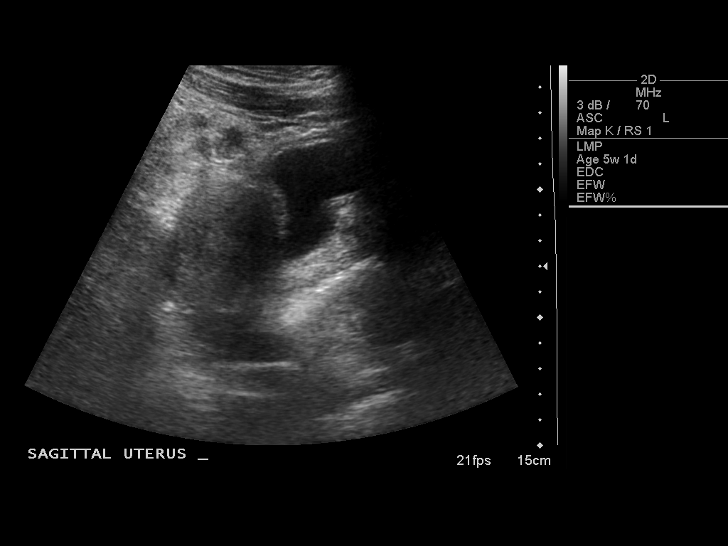
[im 7/58]
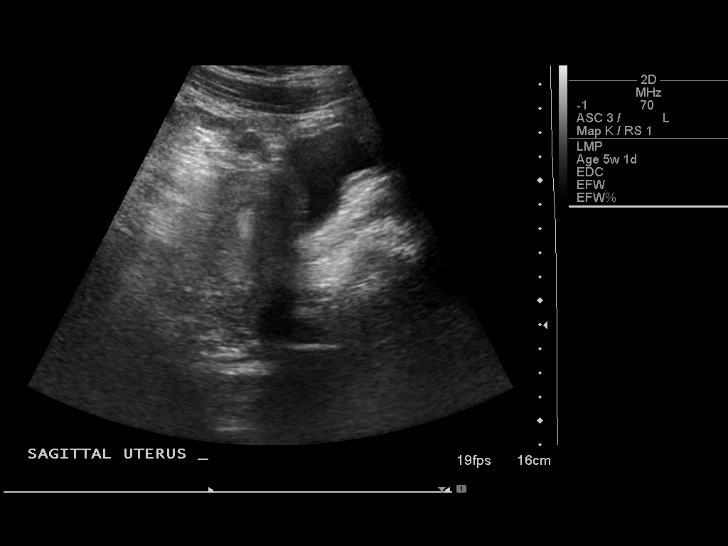
[im 11/58]
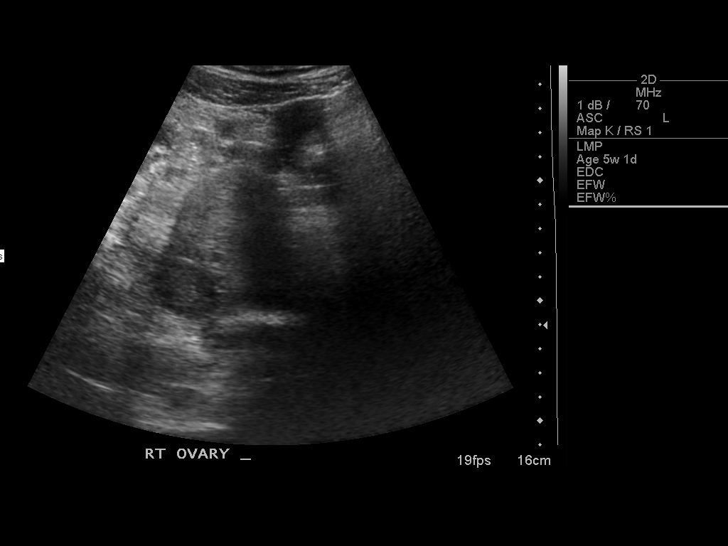
[im 15/58]
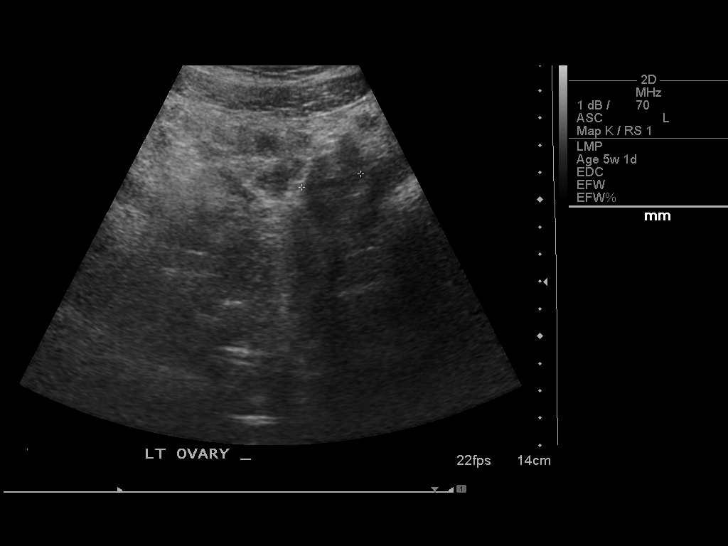
[im 20/58]
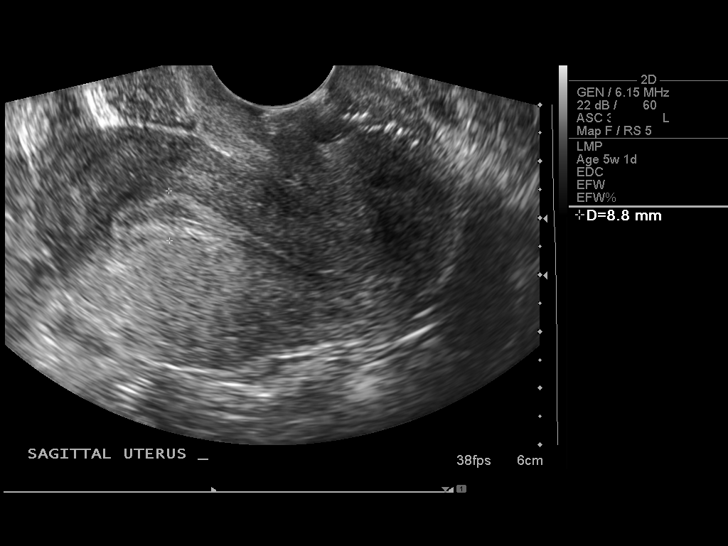
[im 24/58]
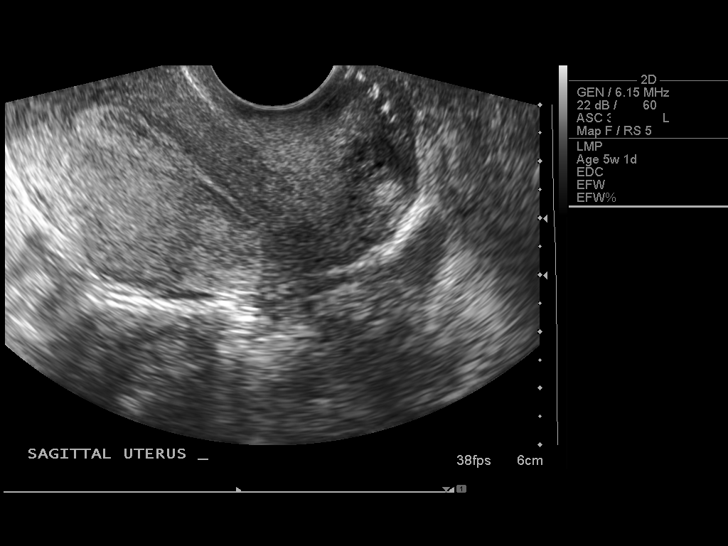
[im 30/58]
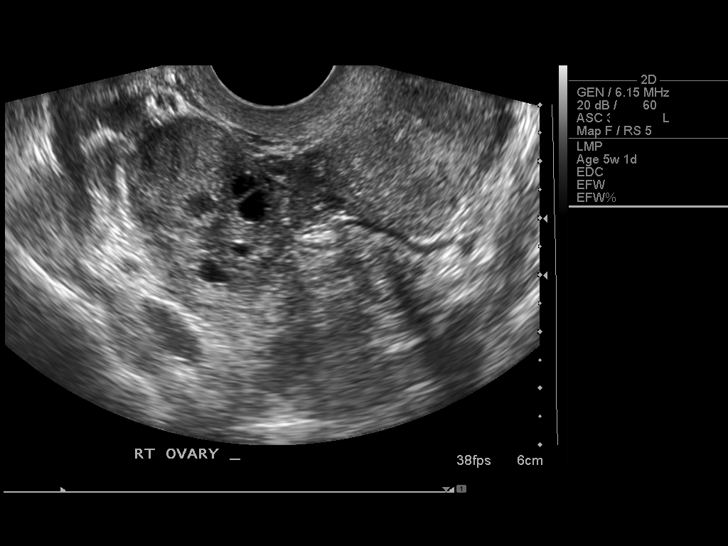
[im 34/58]
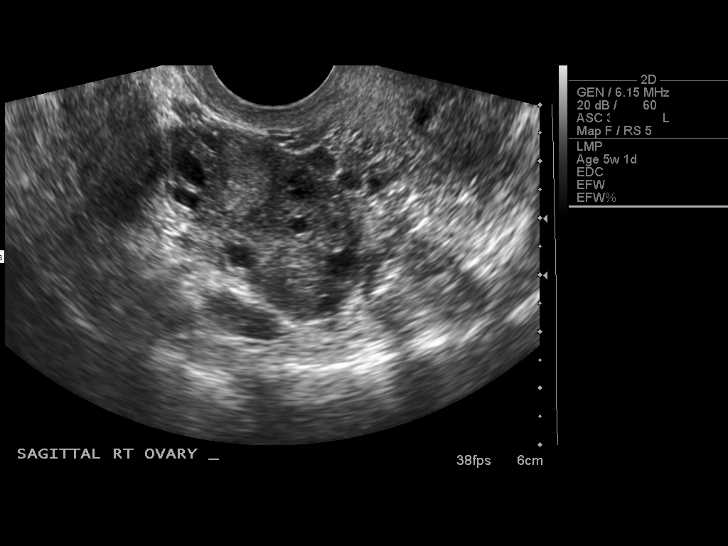
[im 39/58]
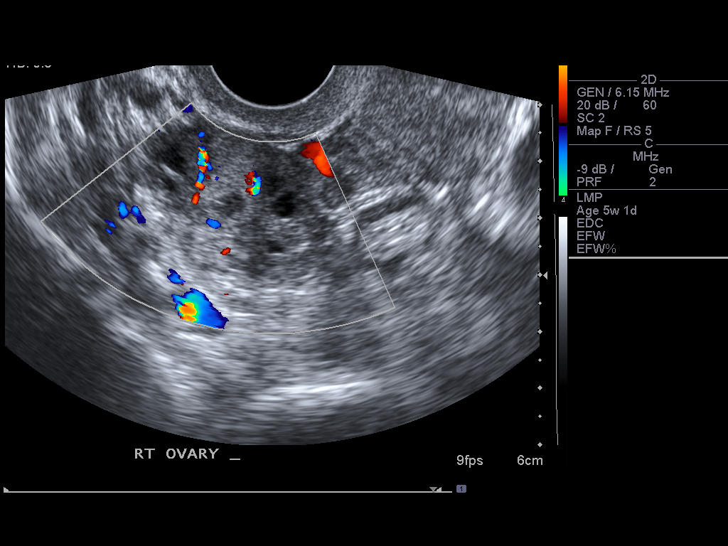
[im 43/58]
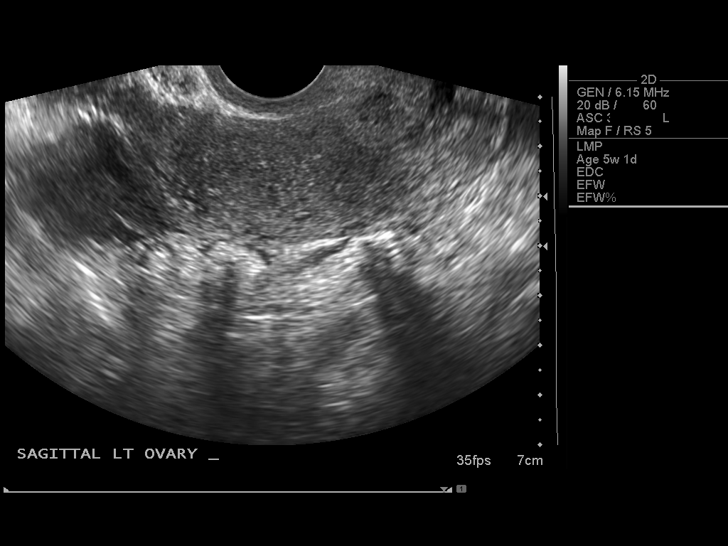
[im 47/58]
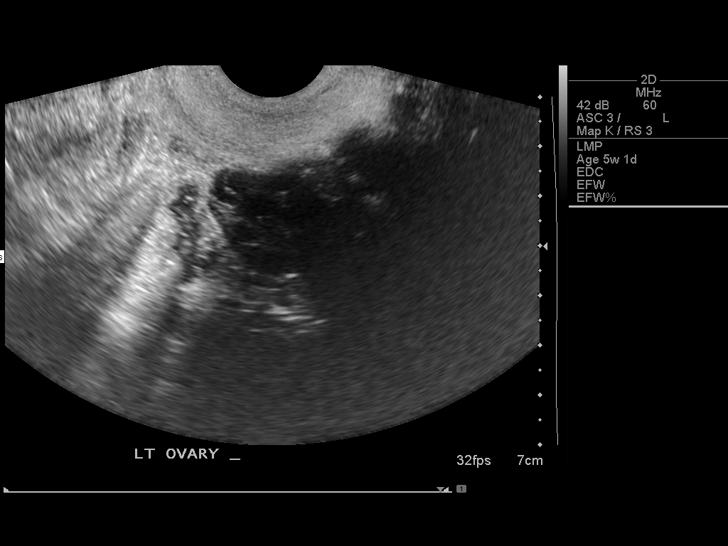
[im 51/58]
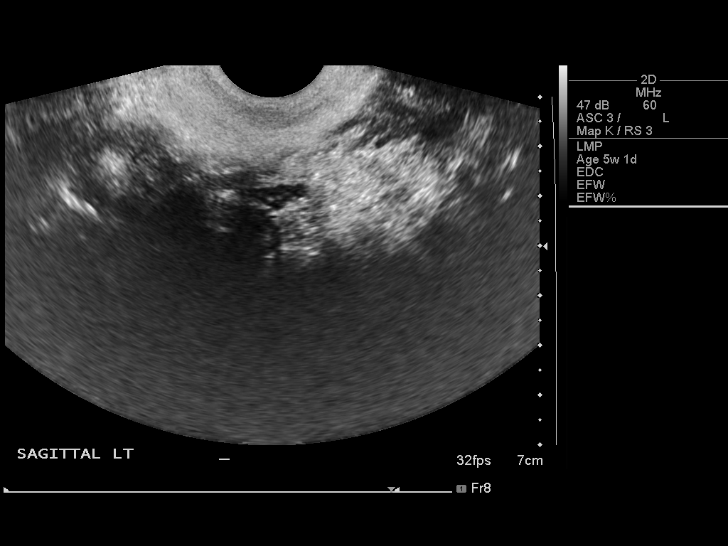
[im 55/58]
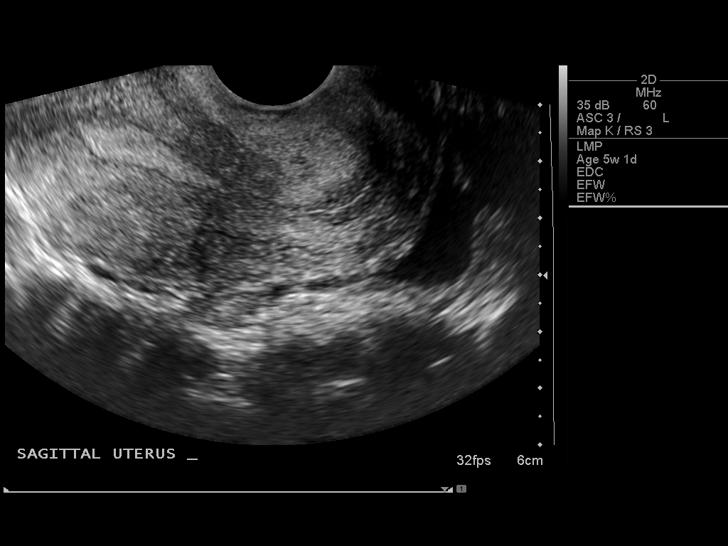

[13 of 28 positions shown; findings below may reference images not displayed]

FINDINGS: Intrauterine gestational sac:  Not visualized.
Yolk sac: N/A
Embryo: N/A
Cardiac Activity: N/A
Heart Rate: N/A

Maternal uterus/adnexae:
The uterus is unremarkable in appearance.  It measures
approximately 8.2 cm in length, 3.8 cm in AP dimension and 5.4 cm
in transverse dimension.  The endometrial echo complex measures
cm in thickness.  Myometrial echogenicity is within normal limits;
no fibroids are seen

The right ovary measures 4.3 x 2.4 x 2.4 cm, while the left ovary
measures 3.8 x 1.7 x 2.4 cm.  Increased echogenicity surrounding a
small cystic focus within the right ovary likely reflects a normal
follicle.  No abnormal color Doppler blood flow is seen to suggest
ectopic pregnancy. There is no evidence of ovarian torsion; limited
Doppler evaluation of both ovaries demonstrates normal color
Doppler blood flow.  No suspicious adnexal masses are seen.

A small amount of free fluid is noted within the pelvic cul-de-sac,
likely physiologic in nature.
IMPRESSION: No intrauterine gestational sac yet seen.  This is to be expected,
given the patient's quantitative beta HCG level of 1.  No evidence
of ovarian torsion; uterus grossly unremarkable in appearance.

## 2013-11-29 ENCOUNTER — Emergency Department (HOSPITAL_COMMUNITY)
Admission: EM | Admit: 2013-11-29 | Discharge: 2013-11-29 | Disposition: A | Discharge status: Home / Self Care | Payer: Self-pay | Source: Home / Self Care | Attending: Family Medicine | Admitting: Family Medicine

## 2013-11-29 ENCOUNTER — Telehealth: Payer: Self-pay | Admitting: *Deleted

## 2013-11-29 ENCOUNTER — Telehealth: Payer: Self-pay

## 2013-11-29 ENCOUNTER — Encounter (HOSPITAL_COMMUNITY): Payer: Self-pay | Admitting: Emergency Medicine

## 2013-11-29 DIAGNOSIS — G43009 Migraine without aura, not intractable, without status migrainosus: Secondary | ICD-10-CM

## 2013-11-29 MED ORDER — METHYLPREDNISOLONE ACETATE 80 MG/ML IJ SUSP
INTRAMUSCULAR | Status: AC
  Filled 2013-11-29: qty 1

## 2013-11-29 MED ORDER — ONDANSETRON 4 MG PO TBDP
4.0000 mg | ORAL_TABLET | Freq: Once | ORAL | Status: AC
  Administered 2013-11-29: 4 mg via ORAL

## 2013-11-29 MED ORDER — ONDANSETRON 4 MG PO TBDP
ORAL_TABLET | ORAL | Status: AC
  Filled 2013-11-29: qty 1

## 2013-11-29 MED ORDER — KETOROLAC TROMETHAMINE 30 MG/ML IJ SOLN
INTRAMUSCULAR | Status: AC
  Filled 2013-11-29: qty 1

## 2013-11-29 MED ORDER — KETOROLAC TROMETHAMINE 30 MG/ML IJ SOLN
30.0000 mg | Freq: Once | INTRAMUSCULAR | Status: AC
  Administered 2013-11-29: 30 mg via INTRAMUSCULAR

## 2013-11-29 MED ORDER — METHYLPREDNISOLONE ACETATE 80 MG/ML IJ SUSP
80.0000 mg | Freq: Once | INTRAMUSCULAR | Status: AC
  Administered 2013-11-29: 80 mg via INTRAMUSCULAR

## 2013-11-29 MED ORDER — FLUTICASONE PROPIONATE 50 MCG/ACT NA SUSP: 2.0000 | Freq: Every day | NASAL | Status: DC

## 2013-11-29 NOTE — ED Provider Notes (Signed)
CSN: [id number]     Arrival date & time 11/29/13  1030 History   None    Chief Complaint  Patient presents with  . Headache   (Consider location/radiation/quality/duration/timing/severity/associated sxs/prior Treatment) Patient is a 27 y.o. female presenting with headaches.  Headache Pain location:  Frontal Radiates to:  Does not radiate Onset quality:  Sudden Duration:  2 days Timing:  Constant Progression:  Worsening Chronicity:  Recurrent Similar to prior headaches: yes (but worse)   Context: bright light (sometimes), emotional stress and loud noise   Relieved by:  Nothing Worsened by:  Sound Ineffective treatments:  NSAIDs Associated symptoms: photophobia (intermittent)   Associated symptoms: no fever and no focal weakness     Past Medical History  Diagnosis Date  . Abortion, nontherapeutic 2004    Elective at 18 weeks   . Strep throat     recurrent approx. 3 times in the past year   . Nausea     mild in the pastt several weeks   . Tonsillitis 2011    Severe - hosptalised in overnight in [place]    Past Surgical History  Procedure Laterality Date  . Induced abortion  2004    at 18 weeks (elective )  . Cesarean section     Family History  Problem Relation Age of Onset  . Cancer Mother     bone marrow    History  Substance Use Topics  . Smoking status: Current Every Day Smoker  . Smokeless tobacco: Not on file  . Alcohol Use: No   OB History   Grav Para Term Preterm Abortions TAB SAB Ect Mult Living   Review of Systems  Constitutional: Negative for fever.  Eyes: Positive for photophobia (intermittent).  Neurological: Positive for headaches. Negative for focal weakness.    Allergies  Review of patient's allergies indicates no known allergies.  Home Medications   Prior to Admission medications   Medication Sig Start Date End Date Taking? Authorizing Provider  Ibuprofen (ADVIL MIGRAINE PO) Take by mouth.   Yes Historical  Provider, MD  ibuprofen (ADVIL,MOTRIN) 200 MG tablet Take 200 mg by mouth every 6 (six) hours as needed.   Yes Historical Provider, MD  OVER THE COUNTER MEDICATION Cold and allergy medicine   Yes Historical Provider, MD  fluticasone (FLONASE) 50 MCG/ACT nasal spray Place 2 sprays into both nostrils daily. 11/29/13   Jacquelin Hawking, MD  naproxen sodium (ANAPROX) 220 MG tablet Take 440 mg by mouth 2 (two) times daily with a meal.    Historical Provider, MD   BP 122/75  Pulse 67  Temp(Src) 98.4 F (36.9 C) (Oral)  Resp 12  SpO2 99% Physical Exam  Constitutional: She is oriented to person, place, and time. She appears well-developed and well-nourished.  Eyes: Conjunctivae and EOM are normal. Pupils are equal, round, and reactive to light. Right eye exhibits no nystagmus. Left eye exhibits no nystagmus.  Cardiovascular: Normal rate, regular rhythm and normal heart sounds.   No murmur heard. Neurological: She is alert and oriented to person, place, and time. No cranial nerve deficit or sensory deficit. She displays a negative Romberg sign.  No dysmetria.    ED Course  Procedures (including critical care time) Medications  methylPREDNISolone acetate (DEPO-MEDROL) injection 80 mg (80 mg Intramuscular Given 11/29/13 1209)  ketorolac (TORADOL) 30 MG/ML injection 30 mg (30 mg Intramuscular Given 11/29/13 1209)  ondansetron (ZOFRAN-ODT) disintegrating tablet 4  mg (4 mg Oral Given 11/29/13 1209)   Labs Review Labs Reviewed - No data to display  Imaging Review No results found.   MDM   1. Migraine without aura and without status migrainosus, not intractable    Symptoms improved slightly with depo-medrol  IM, Toradol  IM and Zofran ODT . Patient to follow-up with PCP regarding continued treatment. Prescribed Flonase for possible sinus etiology of headache. Patient understood and agreed with plan. Stable for discharge home.  The patient was seen with Dr. Artis Flock along with me. He  participated in the history, exam and medical decision making, and agrees with the above plan.    Jacquelin Hawking, MD 11/29/13 [phone]

## 2013-11-29 NOTE — ED Notes (Signed)
C/o headache, onset 9/14.  Reports dizziness started today.  Denies uri symptoms: no cough, no runny nose, no sore throat, no ear pain.  Reports history of migraines, reports they are usually easier to treat at home than this has been.

## 2013-11-29 NOTE — Telephone Encounter (Signed)
Noted  

## 2013-11-29 NOTE — Telephone Encounter (Signed)
Print production planner spoke with patient since she will have to re establish as new patient .

## 2013-11-29 NOTE — Discharge Instructions (Signed)

## 2013-11-29 NOTE — Telephone Encounter (Signed)
Needs to go to urgent care. Can get next avail;able appt if wants to re establish

## 2013-11-29 NOTE — Telephone Encounter (Signed)
Pt called stating she went to the urgent care this morning for margines because Dr. Lodema Hong could not see her today, I made pt aware that our next available is 01/18/14 at 2:45 pt states she cannot wait that long because the urgent care did not give her anything for pain they gave her Flonase and she does not understand that because she is not having any sinuses. Pt said urgent care wanted her to follow up with her PCP. Pt is scheduled for 01/18/14 at 2:45 and is aware of this appointment. Pt still wants to be seen sooner. Please advise [phone]

## 2013-12-02 NOTE — ED Provider Notes (Signed)
Medical screening examination/treatment/procedure(s) were performed by resident physician or non-physician practitioner and as supervising physician I was immediately available for consultation/collaboration.   Aara Jacquot DOUGLAS MD.   Tylar Merendino D Kosisochukwu [place], MD 12/02/13 1641 

## 2014-01-16 ENCOUNTER — Encounter (HOSPITAL_COMMUNITY): Payer: Self-pay | Admitting: Emergency Medicine

## 2014-01-18 ENCOUNTER — Ambulatory Visit: Payer: Medicaid Other | Admitting: Family Medicine

## 2015-10-17 ENCOUNTER — Other Ambulatory Visit (HOSPITAL_COMMUNITY)
Admission: RE | Admit: 2015-10-17 | Discharge: 2015-10-17 | Disposition: A | Discharge status: Home / Self Care | Payer: BLUE CROSS/BLUE SHIELD | Source: Ambulatory Visit | Attending: Family Medicine | Admitting: Family Medicine

## 2015-10-17 ENCOUNTER — Encounter: Payer: Self-pay | Admitting: Family Medicine

## 2015-10-17 ENCOUNTER — Ambulatory Visit (INDEPENDENT_AMBULATORY_CARE_PROVIDER_SITE_OTHER): Payer: BLUE CROSS/BLUE SHIELD | Admitting: Family Medicine

## 2015-10-17 VITALS — BP 110/64 | HR 64 | Ht 63.75 in | Wt 169.2 lb

## 2015-10-17 DIAGNOSIS — Z1151 Encounter for screening for human papillomavirus (HPV): Secondary | ICD-10-CM | POA: Diagnosis present

## 2015-10-17 DIAGNOSIS — Z01419 Encounter for gynecological examination (general) (routine) without abnormal findings: Secondary | ICD-10-CM | POA: Insufficient documentation

## 2015-10-17 DIAGNOSIS — Z0000 Encounter for general adult medical examination without abnormal findings: Secondary | ICD-10-CM

## 2015-10-17 DIAGNOSIS — Z113 Encounter for screening for infections with a predominantly sexual mode of transmission: Secondary | ICD-10-CM

## 2015-10-17 DIAGNOSIS — Z124 Encounter for screening for malignant neoplasm of cervix: Secondary | ICD-10-CM | POA: Diagnosis not present

## 2015-10-17 LAB — COMPREHENSIVE METABOLIC PANEL
ALT: 14 U/L (ref 6–29)
AST: 16 U/L (ref 10–30)
Albumin: 4 g/dL (ref 3.6–5.1)
Alkaline Phosphatase: 58 U/L (ref 33–115)
BUN: 7 mg/dL (ref 7–25)
CO2: 22 mmol/L (ref 20–31)
Calcium: 8.9 mg/dL (ref 8.6–10.2)
Chloride: 106 mmol/L (ref 98–110)
Creat: 0.63 mg/dL (ref 0.50–1.10)
GLUCOSE: 76 mg/dL (ref 65–99)
POTASSIUM: 3.7 mmol/L (ref 3.5–5.3)
Sodium: 140 mmol/L (ref 135–146)
Total Bilirubin: 0.4 mg/dL (ref 0.2–1.2)
Total Protein: 7.3 g/dL (ref 6.1–8.1)

## 2015-10-17 LAB — CBC WITH DIFFERENTIAL/PLATELET
Basophils Absolute: 0 cells/uL (ref 0–200)
Basophils Relative: 0 %
Eosinophils Absolute: 191 cells/uL (ref 15–500)
Eosinophils Relative: 1 %
HEMATOCRIT: 38.5 % (ref 35.0–45.0)
Hemoglobin: 13.2 g/dL (ref 11.7–15.5)
Lymphocytes Relative: 22 %
Lymphs Abs: 4202 cells/uL — ABNORMAL HIGH (ref 850–3900)
MCH: 29.4 pg (ref 27.0–33.0)
MCHC: 34.3 g/dL (ref 32.0–36.0)
MCV: 85.7 fL (ref 80.0–100.0)
MONO ABS: 1146 {cells}/uL — AB (ref 200–950)
MPV: 9.4 fL (ref 7.5–12.5)
Monocytes Relative: 6 %
NEUTROS PCT: 71 %
Neutro Abs: 13561 cells/uL — ABNORMAL HIGH (ref 1500–7800)
Platelets: 307 10*3/uL (ref 140–400)
RBC: 4.49 MIL/uL (ref 3.80–5.10)
RDW: 14.4 % (ref 11.0–15.0)
WBC: 19.1 10*3/uL — ABNORMAL HIGH (ref 4.0–10.5)

## 2015-10-17 LAB — POCT URINALYSIS DIPSTICK
Bilirubin, UA: NEGATIVE
GLUCOSE UA: NEGATIVE
Ketones, UA: NEGATIVE
Leukocytes, UA: NEGATIVE
Nitrite, UA: NEGATIVE
Protein, UA: NEGATIVE
SPEC GRAV UA: 1.025
Urobilinogen, UA: NEGATIVE
pH, UA: 6

## 2015-10-17 NOTE — Patient Instructions (Signed)

## 2015-10-17 NOTE — Progress Notes (Signed)
Subjective:    Patient ID: Emma Carlson, female    DOB: 05/27/1985, 30 y.o.   MRN: [id number]  HPI Chief Complaint  Patient presents with  . Annual Exam    fasting cpe-pap, no concerns   She is new to the practice and here for a complete physical exam. Previous medical care: in [place] Last CPE: 2 years ago  Other providers: Dentist - went today.   Past medical history: none Surgeries: C-section  Family history: mother Leukemia, breast and colon cancer- passed at age 33. Diagnosed at age 47.   Social history: Lives with child 21 years old and husband, works at [employer]  Occasional smoker, drinks alcohol socially, denies drug use  Diet: unhealthy, fast food.  Excerise: none  Immunizations: Tdap 4 years ago  Health maintenance:  Mammogram: never Colonoscopy: never Last Gynecological Exam and Pap: 2 years and normal.  Last Menstrual cycle: 10/09/2015  Pregnancies: 1, trying to get pregnant. Stopped depo one year ago.  Last Dental Exam: today Last Eye Exam: last year, eyeglasses  Wears seatbelt always, uses sunscreen, smoke detectors in home and functioning, does not text while driving and feels safe in home environment.   Reviewed allergies, medications, past medical, surgical, family, and social history.   Review of Systems ROS Review of Systems Constitutional: -fever, -chills, -sweats, -unexpected weight change,-fatigue ENT: -runny nose, -ear pain, -sore throat Cardiology:  -chest pain, -palpitations, -edema Respiratory: -cough, -shortness of breath, -wheezing Gastroenterology: -abdominal pain, -nausea, -vomiting, -diarrhea, -constipation  Hematology: -bleeding or bruising problems Musculoskeletal: -arthralgias, -myalgias, -joint swelling, -back pain Ophthalmology: -vision changes Urology: -dysuria, -difficulty urinating, -hematuria, -urinary frequency, -urgency Neurology: -headache, -weakness, -tingling, -numbness       Objective:   Physical  Exam BP 110/64   Pulse 64   Ht 5' 3.75" (1.619 m)   Wt 169 lb 3.2 oz (76.7 kg)   LMP 10/09/2015   BMI 29.27 kg/m   General Appearance:    Alert, cooperative, no distress, appears stated age  Head:    Normocephalic, without obvious abnormality, atraumatic  Eyes:    PERRL, conjunctiva/corneas clear, EOM's intact, fundi    benign  Ears:    Normal TM's and external ear canals  Nose:   Nares normal, mucosa normal, no drainage or sinus   tenderness  Throat:   Lips, mucosa, and tongue normal; teeth and gums normal  Neck:   Supple, no lymphadenopathy;  thyroid:  no   enlargement/tenderness/nodules; no carotid   bruit or JVD  Back:    Spine nontender, no curvature, ROM normal, no CVA     tenderness  Lungs:     Clear to auscultation bilaterally without wheezes, rales or     ronchi; respirations unlabored  Chest Wall:    No tenderness or deformity   Heart:    Regular rate and rhythm, S1 and S2 normal, no murmur, rub   or gallop  Breast Exam:    No tenderness, masses, or nipple discharge or inversion.      No axillary lymphadenopathy  Abdomen:     Soft, non-tender, nondistended, normoactive bowel sounds,    no masses, no hepatosplenomegaly  Genitalia:    Chaperone present. Normal external genitalia without lesions. Piercing noted. BUS and vagina normal; cervix without lesions, or cervical motion tenderness. No abnormal vaginal discharge.  Uterus and adnexa not enlarged, nontender, no masses.  Pap performed and she tolerated procedure well.   Rectal:    Not performed due to age<40 and  no related complaints  Extremities:   No clubbing, cyanosis or edema  Pulses:   2+ and symmetric all extremities  Skin:   Skin color, texture, turgor normal, no rashes or lesions, multiple tattoos.   Lymph nodes:   Cervical, supraclavicular, and axillary nodes normal  Neurologic:   CNII-XII intact, normal strength, sensation and gait; reflexes 2+ and symmetric throughout          Psych:   Normal mood, affect,  hygiene and grooming.    Urinalysis dipstick: trace blood (menses)      Assessment & Plan:  Routine general medical examination at a health care facility - Plan: POCT urinalysis dipstick, CBC with Differential/Platelet, Comprehensive metabolic panel, Cytology - PAP  Screening for cervical cancer - Plan: Cytology - PAP  Screening for STD (sexually transmitted disease) - Plan: RPR, HIV antibody  Recommend eating a well balanced healthy diet and start getting at least 150 minutes of physical activity.  She appears to be up to date on immunizations.  Discussed safety and health promotion.  STD screening performed per patient request.  Follow up pending labs or in 1 year.

## 2015-10-18 ENCOUNTER — Telehealth: Payer: Self-pay | Admitting: Internal Medicine

## 2015-10-18 DIAGNOSIS — D729 Disorder of white blood cells, unspecified: Secondary | ICD-10-CM

## 2015-10-18 DIAGNOSIS — D72829 Elevated white blood cell count, unspecified: Secondary | ICD-10-CM

## 2015-10-18 LAB — RPR

## 2015-10-18 LAB — HIV ANTIBODY (ROUTINE TESTING W REFLEX): HIV 1&2 Ab, 4th Generation: NONREACTIVE

## 2015-10-18 NOTE — Telephone Encounter (Signed)
-----   Message from Avanell Shackleton, NP sent at 10/18/2015  8:52 AM EDT ----- Please refer her to hematology for leukocytosis and neutrophilia. I tried to call her but no way to leave voice mail. Please let her know that her white blood count is elevated and it was also elevated in the past. I would like to refer her to a hematologist who is a specialist just to get a second opinion as to why this might be going on.  Thanks.

## 2015-10-19 LAB — CYTOLOGY - PAP

## 2015-10-25 ENCOUNTER — Encounter: Payer: Self-pay | Admitting: Internal Medicine

## 2016-03-17 NOTE — L&D Delivery Note (Signed)
Patient is 31 y.o. [id number] admitted PROM with SOL. Prenatal course uncomplicated.  Delivery Note At 7:21 PM a viable female was delivered via VBAC, Spontaneous (Presentation: LOA ).  APGAR: 8, 9; weight pending.   Placenta status: Intact.  Cord: 3V  with the following complications: None.  Cord pH: 7.26  Anesthesia:  Epidural Episiotomy: None Lacerations: None Est. Blood Loss (mL): 250  Mom to postpartum.  Baby to Couplet care / Skin to Skin.  Upon arrival patient was complete and pushing. She pushed with good maternal effort to deliver a viable infant in cephalic, LOA position. No nuchal cord present. Baby's head delivered without difficulty, anterior shoulder had difficulty. A 20s shoulder dystocia was called. Maneuvers used included McRoberts, suprapubic pressure, and ultimately rotational maneuvers to deliver baby. Infant had poor tone and not respirations on presentation so cord clamped and cut immediately. Code APGAR called. Infant handed to NICU team. Placenta delivered spontaneously with gentle cord traction. Fundus firm with massage and Pitocin. Perineum inspected with good hemostasis. Counts of sharps, instruments, and lap pads were all correct.  Caryl AdaJazma Phelps, DO OB Fellow Faculty Practice, [facility] [place] 11/19/2016, 7:38 PM

## 2016-03-31 ENCOUNTER — Encounter: Payer: Self-pay | Admitting: Family Medicine

## 2016-03-31 ENCOUNTER — Ambulatory Visit (INDEPENDENT_AMBULATORY_CARE_PROVIDER_SITE_OTHER): Payer: BLUE CROSS/BLUE SHIELD | Admitting: Family Medicine

## 2016-03-31 VITALS — BP 116/74 | Ht 63.5 in | Wt 172.2 lb

## 2016-03-31 DIAGNOSIS — Z3201 Encounter for pregnancy test, result positive: Secondary | ICD-10-CM

## 2016-03-31 LAB — POCT URINE PREGNANCY: Preg Test, Ur: POSITIVE — AB

## 2016-03-31 NOTE — Addendum Note (Signed)
Addended by: Lavell IslamHOTON, Nicodemus Denk M on: 03/31/2016 08:47 AM   Modules accepted: Orders

## 2016-03-31 NOTE — Progress Notes (Signed)
   Subjective:    Patient ID: Axel FillerMecaala J York, female    DOB: 09/18/1985, 31 y.o.   MRN: [id number]  HPI She is here for evaluation of possible pregnancy. Her last cycle was December 6. She does note swelling and tenderness. This is her second pregnancy.   Review of Systems     Objective:   Physical Exam Alert and in no distress otherwise not examined. Urine pregnancy test was positive       Assessment & Plan:  Positive pregnancy test - Plan: Ambulatory referral to Gynecology

## 2016-04-09 ENCOUNTER — Inpatient Hospital Stay (HOSPITAL_COMMUNITY): Payer: BLUE CROSS/BLUE SHIELD

## 2016-04-09 ENCOUNTER — Inpatient Hospital Stay (HOSPITAL_COMMUNITY)
Admission: AD | Admit: 2016-04-09 | Discharge: 2016-04-09 | Disposition: A | Discharge status: Home / Self Care | Payer: BLUE CROSS/BLUE SHIELD | Source: Ambulatory Visit | Attending: Obstetrics & Gynecology | Admitting: Obstetrics & Gynecology

## 2016-04-09 ENCOUNTER — Encounter (HOSPITAL_COMMUNITY): Payer: Self-pay | Admitting: *Deleted

## 2016-04-09 DIAGNOSIS — N760 Acute vaginitis: Secondary | ICD-10-CM

## 2016-04-09 DIAGNOSIS — R109 Unspecified abdominal pain: Secondary | ICD-10-CM | POA: Insufficient documentation

## 2016-04-09 DIAGNOSIS — Z87891 Personal history of nicotine dependence: Secondary | ICD-10-CM | POA: Insufficient documentation

## 2016-04-09 DIAGNOSIS — O210 Mild hyperemesis gravidarum: Secondary | ICD-10-CM | POA: Insufficient documentation

## 2016-04-09 DIAGNOSIS — O23591 Infection of other part of genital tract in pregnancy, first trimester: Secondary | ICD-10-CM | POA: Diagnosis not present

## 2016-04-09 DIAGNOSIS — Z3A01 Less than 8 weeks gestation of pregnancy: Secondary | ICD-10-CM | POA: Diagnosis not present

## 2016-04-09 DIAGNOSIS — O26899 Other specified pregnancy related conditions, unspecified trimester: Secondary | ICD-10-CM

## 2016-04-09 DIAGNOSIS — O26891 Other specified pregnancy related conditions, first trimester: Secondary | ICD-10-CM

## 2016-04-09 DIAGNOSIS — B9689 Other specified bacterial agents as the cause of diseases classified elsewhere: Secondary | ICD-10-CM | POA: Diagnosis not present

## 2016-04-09 LAB — WET PREP, GENITAL
Sperm: NONE SEEN
TRICH WET PREP: NONE SEEN
Yeast Wet Prep HPF POC: NONE SEEN

## 2016-04-09 LAB — URINALYSIS, ROUTINE W REFLEX MICROSCOPIC
Bilirubin Urine: NEGATIVE
Glucose, UA: NEGATIVE mg/dL
HGB URINE DIPSTICK: NEGATIVE
KETONES UR: NEGATIVE mg/dL
Leukocytes, UA: NEGATIVE
NITRITE: NEGATIVE
PH: 6 (ref 5.0–8.0)
Protein, ur: NEGATIVE mg/dL
Specific Gravity, Urine: 1.014 (ref 1.005–1.030)

## 2016-04-09 LAB — CBC WITH DIFFERENTIAL/PLATELET
BASOS PCT: 0 %
Basophils Absolute: 0 10*3/uL (ref 0.0–0.1)
Eosinophils Absolute: 0.4 10*3/uL (ref 0.0–0.7)
Eosinophils Relative: 2 %
HCT: 36.7 % (ref 36.0–46.0)
Hemoglobin: 12.5 g/dL (ref 12.0–15.0)
LYMPHS ABS: 4.8 10*3/uL — AB (ref 0.7–4.0)
Lymphocytes Relative: 25 %
MCH: 29.3 pg (ref 26.0–34.0)
MCHC: 34.1 g/dL (ref 30.0–36.0)
MCV: 85.9 fL (ref 78.0–100.0)
Monocytes Absolute: 1.6 10*3/uL — ABNORMAL HIGH (ref 0.1–1.0)
Monocytes Relative: 8 %
NEUTROS ABS: 12.6 10*3/uL — AB (ref 1.7–7.7)
Neutrophils Relative %: 65 %
Platelets: 337 10*3/uL (ref 150–400)
RBC: 4.27 MIL/uL (ref 3.87–5.11)
RDW: 13.6 % (ref 11.5–15.5)
WBC: 19.4 10*3/uL — ABNORMAL HIGH (ref 4.0–10.5)

## 2016-04-09 LAB — HCG, QUANTITATIVE, PREGNANCY: HCG, BETA CHAIN, QUANT, S: 137063 m[IU]/mL — AB (ref <5)

## 2016-04-09 LAB — ABO/RH: ABO/RH(D): A POS

## 2016-04-09 MED ORDER — METRONIDAZOLE 0.75 % VA GEL: 1.0000 | Freq: Two times a day (BID) | VAGINAL | 0 refills | Status: DC

## 2016-04-09 NOTE — MAU Note (Signed)
Urine in lab 

## 2016-04-09 NOTE — Discharge Instructions (Signed)
Eating Plan for Hyperemesis Gravidarum Hyperemesis gravidarum is a severe form of morning sickness. Because this condition causes severe nausea and vomiting, it can lead to dehydration, malnutrition, and weight loss. One way to lessen the symptoms of nausea and vomiting is to follow the eating plan for hyperemesis gravidarum. It is often used along with prescribed medicines to control your symptoms. What can I do to relieve my symptoms? Listen to your body. Everyone is different and has different preferences. Find what works best for you. Take any of the following actions that are helpful to you:  Eat and drink slowly.  Eat 5-6 small meals daily instead of 3 large meals.  Eat crackers before you get out of bed in the morning.  Try having a snack in the middle of the night.  Starchy foods are usually tolerated well. Examples include cereal, toast, bread, potatoes, pasta, rice, and pretzels.  Ginger may help with nausea. Add  tsp ground ginger to hot tea or choose ginger tea.  Try drinking 100% fruit juice or an electrolyte drink. An electrolyte drink contains sodium, potassium, and chloride.  Continue to take your prenatal vitamins as told by your health care provider. If you are having trouble taking your prenatal vitamins, talk with your health care provider about different options.  Include at least 1 serving of protein with your meals and snacks. Protein options include meats or poultry, beans, nuts, eggs, and yogurt. Try eating a protein-rich snack before bed. Examples of these snacks include cheese and crackers or half of a peanut butter or [place] sandwich.  Consider eliminating foods that trigger your symptoms. These may include spicy foods, coffee, high-fat foods, very sweet foods, and acidic foods.  Try meals that have more protein combined with bland, salty, lower-fat, and dry foods, such as nuts, seeds, pretzels, crackers, and cereal.  Talk with your healthcare provider about  starting a supplement of vitamin B6.  Have fluids that are cold, clear, and carbonated or sour. Examples include lemonade, ginger ale, lemon-lime soda, ice water, and sparkling water.  Try lemon or mint tea.  Try brushing your teeth or using a mouth rinse after meals. What should I avoid to reduce my symptoms? Avoiding some of the following things may help reduce your symptoms.  Foods with strong smells. Try eating meals in well-ventilated areas that are free of odors.  Drinking water or other beverages with meals. Try not to drink anything during the 30 minutes before and after your meals.  Drinking more than 1 cup of fluid at a time. Sometimes using a straw helps.  Fried or high-fat foods, such as butter and cream sauces.  Spicy foods.  Skipping meals as best as you can. Nausea can be more intense on an empty stomach. If you cannot tolerate food at that time, do not force it. Try sucking on ice chips or other frozen items, and make up for missed calories later.  Lying down within 2 hours after eating.  Environmental triggers. These may include smoky rooms, closed spaces, rooms with strong smells, warm or humid places, overly loud and noisy rooms, and rooms with motion or flickering lights.  Quick and sudden changes in your movement. This information is not intended to replace advice given to you by your health care provider. Make sure you discuss any questions you have with your health care provider. Document Released: 12/29/2006 Document Revised: 10/31/2015 Document Reviewed: 10/02/2015 Elsevier Interactive Patient Education  2017 [employer]. First Trimester of Pregnancy The first  trimester of pregnancy is from week 1 until the end of week 12 (months 1 through 3). During this time, your baby will begin to develop inside you. At 6-8 weeks, the eyes and face are formed, and the heartbeat can be seen on ultrasound. At the end of 12 weeks, all the baby's organs are formed. Prenatal  care is all the medical care you receive before the birth of your baby. Make sure you get good prenatal care and follow all of your doctor's instructions. Follow these instructions at home: Medicines  Take medicine only as told by your doctor. Some medicines are safe and some are not during pregnancy.  Take your prenatal vitamins as told by your doctor.  Take medicine that helps you poop (stool softener) as needed if your doctor says it is okay. Diet  Eat regular, healthy meals.  Your doctor will tell you the amount of weight gain that is right for you.  Avoid raw meat and uncooked cheese.  If you feel sick to your stomach (nauseous) or throw up (vomit):  Eat 4 or 5 small meals a day instead of 3 large meals.  Try eating a few soda crackers.  Drink liquids between meals instead of during meals.  If you have a hard time pooping (constipation):  Eat high-fiber foods like fresh vegetables, fruit, and whole grains.  Drink enough fluids to keep your pee (urine) clear or pale yellow. Activity and Exercise  Exercise only as told by your doctor. Stop exercising if you have cramps or pain in your lower belly (abdomen) or low back.  Try to avoid standing for long periods of time. Move your legs often if you must stand in one place for a long time.  Avoid heavy lifting.  Wear low-heeled shoes. Sit and stand up straight.  You can have sex unless your doctor tells you not to. Relief of Pain or Discomfort  Wear a good support bra if your breasts are sore.  Take warm water baths (sitz baths) to soothe pain or discomfort caused by hemorrhoids. Use hemorrhoid cream if your doctor says it is okay.  Rest with your legs raised if you have leg cramps or low back pain.  Wear support hose if you have puffy, bulging veins (varicose veins) in your legs. Raise (elevate) your feet for 15 minutes, 3-4 times a day. Limit salt in your diet. Prenatal Care  Schedule your prenatal visits by the  twelfth week of pregnancy.  Write down your questions. Take them to your prenatal visits.  Keep all your prenatal visits as told by your doctor. Safety  Wear your seat belt at all times when driving.  Make a list of emergency phone numbers. The list should include numbers for family, friends, the hospital, and police and fire departments. General Tips  Ask your doctor for a referral to a local prenatal class. Begin classes no later than at the start of month 6 of your pregnancy.  Ask for help if you need counseling or help with nutrition. Your doctor can give you advice or tell you where to go for help.  Do not use hot tubs, steam rooms, or saunas.  Do not douche or use tampons or scented sanitary pads.  Do not cross your legs for long periods of time.  Avoid litter boxes and soil used by cats.  Avoid all smoking, herbs, and alcohol. Avoid drugs not approved by your doctor.  Do not use any tobacco products, including cigarettes, chewing tobacco, and  electronic cigarettes. If you need help quitting, ask your doctor. You may get counseling or other support to help you quit.  Visit your dentist. At home, brush your teeth with a soft toothbrush. Be gentle when you floss. Get help if:  You are dizzy.  You have mild cramps or pressure in your lower belly.  You have a nagging pain in your belly area.  You continue to feel sick to your stomach, throw up, or have watery poop (diarrhea).  You have a bad smelling fluid coming from your vagina.  You have pain with peeing (urination).  You have increased puffiness (swelling) in your face, hands, legs, or ankles. Get help right away if:  You have a fever.  You are leaking fluid from your vagina.  You have spotting or bleeding from your vagina.  You have very bad belly cramping or pain.  You gain or lose weight rapidly.  You throw up blood. It may look like coffee grounds.  You are around people who have [place] measles,  fifth disease, or chickenpox.  You have a very bad headache.  You have shortness of breath.  You have any kind of trauma, such as from a fall or a car accident. This information is not intended to replace advice given to you by your health care provider. Make sure you discuss any questions you have with your health care provider. Document Released: 08/20/2007 Document Revised: 08/09/2015 Document Reviewed: 01/11/2013 Elsevier Interactive Patient Education  2017 [employer]

## 2016-04-09 NOTE — MAU Provider Note (Signed)
History     CSN: [id number]  Arrival date and time: 04/09/16 [id number]   First Provider Initiated Contact with Patient 04/09/16 0920      Chief Complaint  Patient presents with  . Abdominal Pain   HPI [name] is a [age] y.o. [id number] at 5481w0d by LMP who presents to MAU today with complaint of abdominal pain x 2 weeks, but worse for the last week. She rates her pain at 4/10, but sometimes worse up to 10/10. She denies vaginal bleeding, discharge, diarrhea, constipation, fever or UTI symptoms today. She has not taken anything for pain.    OB History    Gravida Para Term Preterm AB Living   4       2 1    SAB TAB Ectopic Multiple Live Births   1 1            Past Medical History:  Diagnosis Date  . Abortion, nontherapeutic 2004   Elective at 18 weeks   . Nausea    mild in the pastt several weeks   . Strep throat    recurrent approx. 3 times in the past year   . Tonsillitis 2011   Severe [place]     Past Surgical History:  Procedure Laterality Date  . CESAREAN SECTION    . INDUCED ABORTION  2004   at 18 weeks (elective )    Family History  Problem Relation Age of Onset  . Cancer Mother 5250    bone marrow     Social History  Substance Use Topics  . Smoking status: Former Games developermoker  . Smokeless tobacco: Never Used  . Alcohol use No    Allergies: No Known Allergies  No prescriptions prior to admission.    Review of Systems  Constitutional: Negative for fever.  Gastrointestinal: Positive for abdominal pain. Negative for constipation, diarrhea, nausea and vomiting.  Genitourinary: Negative for dysuria, frequency, urgency, vaginal bleeding and vaginal discharge.  Musculoskeletal: Positive for back pain.   Physical Exam   Blood pressure 106/57, pulse 72, temperature 98.2 F (36.8 C), temperature source Oral, resp. rate 18, height 5\' 1"  (1.549 m), weight 175 lb (79.4 kg), last menstrual period 02/20/2016.  Physical Exam   Nursing note and vitals reviewed. Constitutional: She is oriented to person, place, and time. She appears well-developed and well-nourished. No distress.  HENT:  Head: Normocephalic and atraumatic.  Cardiovascular: Normal rate.   Respiratory: Effort normal.  GI: Soft. She exhibits no distension and no mass. There is no tenderness. There is no rebound and no guarding.  Neurological: She is alert and oriented to person, place, and time.  Skin: Skin is warm and dry. No erythema.  Psychiatric: She has a normal mood and affect.    Results for orders placed or performed during the hospital encounter of 04/09/16 (from the past 24 hour(s))  CBC with Differential/Platelet     Status: Abnormal   Collection Time: 04/09/16  9:15 AM  Result Value Ref Range   WBC 19.4 (H) 4.0 - 10.5 K/uL   RBC 4.27 3.87 - 5.11 MIL/uL   Hemoglobin 12.5 12.0 - 15.0 g/dL   HCT 29.536.7 62.136.0 - 30.846.0 %   MCV 85.9 78.0 - 100.0 fL   MCH 29.3 26.0 - 34.0 pg   MCHC 34.1 30.0 - 36.0 g/dL   RDW 65.713.6 84.611.5 - 96.215.5 %   Platelets 337 150 - 400 K/uL   Neutrophils Relative % 65 %  Neutro Abs 12.6 (H) 1.7 - 7.7 K/uL   Lymphocytes Relative 25 %   Lymphs Abs 4.8 (H) 0.7 - 4.0 K/uL   Monocytes Relative 8 %   Monocytes Absolute 1.6 (H) 0.1 - 1.0 K/uL   Eosinophils Relative 2 %   Eosinophils Absolute 0.4 0.0 - 0.7 K/uL   Basophils Relative 0 %   Basophils Absolute 0.0 0.0 - 0.1 K/uL  ABO/Rh     Status: None   Collection Time: 04/09/16  9:15 AM  Result Value Ref Range   ABO/RH(D) A POS   hCG, quantitative, pregnancy     Status: Abnormal   Collection Time: 04/09/16  9:15 AM  Result Value Ref Range   hCG, Beta Chain, Quant, S 137,063 (H) <5 mIU/mL  Wet prep, genital     Status: Abnormal   Collection Time: 04/09/16  9:40 AM  Result Value Ref Range   Yeast Wet Prep HPF POC NONE SEEN NONE SEEN   Trich, Wet Prep NONE SEEN NONE SEEN   Clue Cells Wet Prep HPF POC PRESENT (A) NONE SEEN   WBC, Wet Prep HPF POC FEW (A) NONE SEEN    Sperm NONE SEEN   Urinalysis, Routine w reflex microscopic     Status: None   Collection Time: 04/09/16 10:00 AM  Result Value Ref Range   Color, Urine YELLOW YELLOW   APPearance CLEAR CLEAR   Specific Gravity, Urine 1.014 1.005 - 1.030   pH 6.0 5.0 - 8.0   Glucose, UA NEGATIVE NEGATIVE mg/dL   Hgb urine dipstick NEGATIVE NEGATIVE   Bilirubin Urine NEGATIVE NEGATIVE   Ketones, ur NEGATIVE NEGATIVE mg/dL   Protein, ur NEGATIVE NEGATIVE mg/dL   Nitrite NEGATIVE NEGATIVE   Leukocytes, UA NEGATIVE NEGATIVE   US Ob Comp Less 14 Wks  Result Date: 04/09/2016 CLINICAL DATA:  Lower abdominal cramping EXAM: OBSTETRIC <14 WK [place] AND TRANSVAGINAL OB US TECHNIQUE: Both transabdominal and transvaginal ultrasound examinations were performed for complete evaluation of the gestation as well as the maternal uterus, adnexal regions, and pelvic cul-de-sac. Transvaginal technique was performed to assess early pregnancy. COMPARISON:  None. FINDINGS: Intrauterine gestational sac: Single Yolk sac:  Visualized Embryo:  Visualized Cardiac Activity: Visualized Heart Rate: 165  bpm MSD:   mm    w     d CRL:  15.2  mm   7 w   6 d                  [place] EDC: 11/20/2016 Subchorionic hemorrhage:  None visualized. Maternal uterus/adnexae: No adnexal masses or free fluid. IMPRESSION: Seven week 6 day intrauterine pregnancy. Fetal heart rate 165 beats per minute. No acute maternal findings. Electronically Signed   By: Charlett Nose M.D.   On: 04/09/2016 10:45   US Ob Transvaginal  Result Date: 04/09/2016 CLINICAL DATA:  Lower abdominal cramping EXAM: OBSTETRIC <14 WK [place] AND TRANSVAGINAL OB US TECHNIQUE: Both transabdominal and transvaginal ultrasound examinations were performed for complete evaluation of the gestation as well as the maternal uterus, adnexal regions, and pelvic cul-de-sac. Transvaginal technique was performed to assess early pregnancy. COMPARISON:  None. FINDINGS: Intrauterine gestational sac: Single Yolk sac:   Visualized Embryo:  Visualized Cardiac Activity: Visualized Heart Rate: 165  bpm MSD:   mm    w     d CRL:  15.2  mm   7 w   6 d  [place] EDC: 11/20/2016 Subchorionic hemorrhage:  None visualized. Maternal uterus/adnexae: No adnexal masses or free fluid. IMPRESSION: Seven week 6 day intrauterine pregnancy. Fetal heart rate 165 beats per minute. No acute maternal findings. Electronically Signed   By: Charlett Nose M.D.   On: 04/09/2016 10:45     MAU Course  Procedures None  MDM [facility] at [facility] on 03/31/16 UA, wet prep, GC/chlamydia, CBC, ABO/Rh, quant hCG, HIV, RPR and [place] today to rule out ectopic pregnancy  Assessment and Plan  A: SIUP at [redacted]w[redacted]d Bacterial Vaginosis Abdominal pain in pregnancy, first trimester Nausea and vomiting in pregnancy prior to [redacted] weeks gestation   P:  Discharge home Rx for Metrogel given to patient  Patient declines Rx for anti-emetics at this time Pregnancy confirmation letter given  First trimester precautions discussed Patient advised to follow-up with [facility] as scheduled to start prenatal care  Patient may return to MAU as needed or if her condition were to change or worsen   Marny Lowenstein, PA-C  04/09/2016, 11:44 AM

## 2016-04-09 NOTE — MAU Note (Signed)
Has been having cramping in lower abd, seems to be getting worse, esp at night.  Denies bleeding. No other issues.   States LMP was in Dec, November's was short

## 2016-04-10 LAB — HIV ANTIBODY (ROUTINE TESTING W REFLEX): HIV Screen 4th Generation wRfx: NONREACTIVE

## 2016-04-10 LAB — CERVICOVAGINAL ANCILLARY ONLY
CHLAMYDIA, DNA PROBE: NEGATIVE
Neisseria Gonorrhea: NEGATIVE

## 2016-04-10 LAB — RPR: RPR Ser Ql: NONREACTIVE

## 2016-05-01 DIAGNOSIS — Z13220 Encounter for screening for lipoid disorders: Secondary | ICD-10-CM | POA: Diagnosis not present

## 2016-05-01 DIAGNOSIS — Z713 Dietary counseling and surveillance: Secondary | ICD-10-CM | POA: Diagnosis not present

## 2016-05-01 DIAGNOSIS — Z136 Encounter for screening for cardiovascular disorders: Secondary | ICD-10-CM | POA: Diagnosis not present

## 2016-05-01 DIAGNOSIS — Z6830 Body mass index (BMI) 30.0-30.9, adult: Secondary | ICD-10-CM | POA: Diagnosis not present

## 2016-05-07 ENCOUNTER — Other Ambulatory Visit (HOSPITAL_COMMUNITY)
Admission: RE | Admit: 2016-05-07 | Discharge: 2016-05-07 | Disposition: A | Discharge status: Home / Self Care | Payer: BLUE CROSS/BLUE SHIELD | Source: Ambulatory Visit | Attending: Certified Nurse Midwife | Admitting: Certified Nurse Midwife

## 2016-05-07 ENCOUNTER — Ambulatory Visit (INDEPENDENT_AMBULATORY_CARE_PROVIDER_SITE_OTHER): Payer: BLUE CROSS/BLUE SHIELD | Admitting: Certified Nurse Midwife

## 2016-05-07 ENCOUNTER — Encounter: Payer: Self-pay | Admitting: Certified Nurse Midwife

## 2016-05-07 VITALS — BP 95/65 | HR 77 | Wt 173.0 lb

## 2016-05-07 DIAGNOSIS — Z3481 Encounter for supervision of other normal pregnancy, first trimester: Secondary | ICD-10-CM | POA: Insufficient documentation

## 2016-05-07 DIAGNOSIS — Z3491 Encounter for supervision of normal pregnancy, unspecified, first trimester: Secondary | ICD-10-CM

## 2016-05-07 DIAGNOSIS — Z3A11 11 weeks gestation of pregnancy: Secondary | ICD-10-CM | POA: Insufficient documentation

## 2016-05-07 DIAGNOSIS — Z3490 Encounter for supervision of normal pregnancy, unspecified, unspecified trimester: Secondary | ICD-10-CM | POA: Diagnosis not present

## 2016-05-07 LAB — POCT URINALYSIS DIPSTICK
BILIRUBIN UA: NEGATIVE
Glucose, UA: NEGATIVE
Ketones, UA: NEGATIVE
Leukocytes, UA: NEGATIVE
NITRITE UA: NEGATIVE
PH UA: 7
Protein, UA: NEGATIVE
RBC UA: NEGATIVE
SPEC GRAV UA: 1.01
UROBILINOGEN UA: 1

## 2016-05-07 MED ORDER — DOXYLAMINE-PYRIDOXINE 10-10 MG PO TBEC: DELAYED_RELEASE_TABLET | ORAL | 4 refills | Status: DC

## 2016-05-07 NOTE — Progress Notes (Signed)
Subjective:    Emma Carlson is being seen today for her first obstetrical visit.  This is a planned pregnancy. She is at [redacted]w[redacted]d gestation. Her obstetrical history is significant for anemia. Relationship with FOB: spouse, living together. Patient does intend to breast feed. Pregnancy history fully reviewed. Pt had pregnancy confirmed in the [facility]U, on /24, pt was having n/v. Pt stilling having N/V not taking anything fore it at this time, but would like too. Pt has/h/o anemia and take PNV with iron, no problem with constipation, pt denies h/o sti, no further concerns at this visit.   The information documented in the HPI was reviewed and verified.  Menstrual History: OB History    Gravida Para Term Preterm AB Living   3 1 1  0 1 1   SAB TAB Ectopic Multiple Live Births   1 0 0 0 1      Menarche age: 43 Patient's last menstrual period was 02/20/2016.    Past Medical History:  Diagnosis Date  . Abortion, nontherapeutic 2004   Elective at 18 weeks   . Nausea    mild in the pastt several weeks   . Strep throat    recurrent approx. 3 times in the past year   . Tonsillitis 2011   Severe - hosptalised in overnight in [place]     Past Surgical History:  Procedure Laterality Date  . CESAREAN SECTION    . INDUCED ABORTION  2004   at 18 weeks (elective )  . MOUTH SURGERY       (Not in a hospital admission) No Known Allergies  Social History  Substance Use Topics  . Smoking status: Former Games developer  . Smokeless tobacco: Never Used  . Alcohol use No    Family History  Problem Relation Age of Onset  . Cancer Mother 57    bone marrow      Review of Systems Constitutional: negative for weight loss Gastrointestinal: negative for vomiting Genitourinary:negative for genital lesions and vaginal discharge and dysuria Musculoskeletal:negative for back pain Behavioral/Psych: negative for abusive relationship, depression, illegal drug usage and tobacco use    Objective:    BP 95/65    Pulse 77   Wt 173 lb (78.5 kg)   LMP 02/20/2016   BMI 32.69 kg/m  General Appearance:    Alert, cooperative, no distress, appears stated age  Head:    Normocephalic, without obvious abnormality, atraumatic  Eyes:    PERRL, conjunctiva/corneas clear, EOM's intact, fundi    benign, both eyes  Ears:    Normal TM's and external ear canals, both ears  Nose:   Nares normal, septum midline, mucosa normal, no drainage    or sinus tenderness  Throat:   Lips, mucosa, and tongue normal; teeth and gums normal  Neck:   Supple, symmetrical, trachea midline, no adenopathy;    thyroid:  no enlargement/tenderness/nodules; no carotid   bruit or JVD  Back:     Symmetric, no curvature, ROM normal, no CVA tenderness  Lungs:     Clear to auscultation bilaterally, respirations unlabored  Chest Wall:    No tenderness or deformity   Heart:    Regular rate and rhythm, S1 and S2 normal, no murmur, rub   or gallop  Breast Exam:    No tenderness, masses, or nipple abnormality  Abdomen:     Soft, non-tender, bowel sounds active all four quadrants,    no masses, no organomegaly  Genitalia:    Normal female without lesion, discharge  or tenderness  Extremities:   Extremities normal, atraumatic, no cyanosis or edema  Pulses:   2+ and symmetric all extremities  Skin:   Skin color, texture, turgor normal, no rashes or lesions  Lymph nodes:   Cervical, supraclavicular, and axillary nodes normal  Neurologic:   CNII-XII intact, normal strength, sensation and reflexes    throughout  Cervix: vulva pink, no erythema , no edema, uterus, non tender, fundal height consistent with date, around suprapubic, OS closed, firm, pink, no discharge present, cervix non-friable, no CMT.  Fetal cardiac activity present with sonosite at bedside.     Lab Review Urine pregnancy test Labs reviewed {YES Radiologic studies reviewed {YES Assessment:    Pregnancy at 6284w0d weeks   Encounter for supervision of normal pregnancy, antepartum,  unspecified gravidity - Plan: Hemoglobinopathy evaluation, Varicella zoster antibody, IgG, VITAMIN D 25 Hydroxy (Vit-D Deficiency, Fractures), Culture, OB Urine, MaterniT21 PLUS Core+SCA, Cystic Fibrosis Mutation 97, Obstetric Panel, Including HIV, Cervicovaginal ancillary only, Hemoglobin A1c, POCT urinalysis dipstick  Plan:      Prenatal vitamins.  Counseling provided regarding continued use of seat belts, cessation of alcohol consumption, smoking or use of illicit drugs; infection precautions i.e., influenza/TDAP immunizations, toxoplasmosis,CMV, parvovirus, listeria and varicella; workplace safety, exercise during pregnancy; routine dental care, safe medications, sexual activity, hot tubs, saunas, pools, travel, caffeine use, fish and methlymercury, potential toxins, hair treatments, varicose veins Weight gain recommendations per IOM guidelines reviewed: underweight/BMI< 18.5--> gain 28 - 40 lbs; normal weight/BMI 18.5 - 24.9--> gain 25 - 35 lbs; overweight/BMI 25 - 29.9--> gain 15 - 25 lbs; obese/BMI >30->gain  11 - 20 lbs Problem list reviewed and updated. FIRST/CF mutation testing/NIPT/QUAD SCREEN/fragile X/Ashkenazi Jewish population testing/Spinal muscular atrophy discussed: requested. Role of ultrasound in pregnancy discussed; fetal survey: requested. Amniocentesis discussed: not indicated. VBAC calculator score: VBAC consent form provided Meds ordered this encounter  Medications  . Doxylamine-Pyridoxine (DICLEGIS) 10-10 MG TBEC    Sig: Take 1 tablet with breakfast and lunch.  Take 2 tablets at bedtime.    Dispense:  100 tablet    Refill:  4   Orders Placed This Encounter  Procedures  . Culture, OB Urine  . Hemoglobinopathy evaluation  . Varicella zoster antibody, IgG  . VITAMIN D 25 Hydroxy (Vit-D Deficiency, Fractures)  . MaterniT21 PLUS Core+SCA    Order Specific Question:   Is the patient insulin dependent?    Answer:   No    Order Specific Question:   Please enter  gestational age. This should be expressed as weeks AND days, i.e. 16w 6d. Enter weeks here. Enter days in next question.    Answer:   411    Order Specific Question:   Please enter gestational age. This should be expressed as weeks AND days, i.e. 16w 6d. Enter days here. Enter weeks in previous question.    Answer:   6    Order Specific Question:   How was gestational age calculated?    Answer:   Ultrasound    Order Specific Question:   Please give the date of LMP OR Ultrasound OR Estimated date of delivery.    Answer:   11/20/2016    Order Specific Question:   Number of Fetuses (Type of Pregnancy):    Answer:   1    Order Specific Question:   Indications for performing the test? (please choose all that apply):    Answer:   Routine screening    Order Specific Question:   Other Indications? (Y=Yes, N=No)  Answer:   N    Order Specific Question:   If this is a repeat specimen, please indicate the reason:    Answer:   Not indicated    Order Specific Question:   Please specify the patient's race: (C=White/Caucasion, B=Black, I=Native American, A=Asian, H=Hispanic, O=Other, U=Unknown)    Answer:   B    Order Specific Question:   Donor Egg - indicate if the egg was obtained from in vitro fertilization.    Answer:   N    Order Specific Question:   Age of Egg Donor.    Answer:   67    Order Specific Question:   Prior Down Syndrome/ONTD screening during current pregnancy.    Answer:   N    Order Specific Question:   Prior First Trimester Testing    Answer:   N    Order Specific Question:   Prior Second Trimester Testing    Answer:   N    Order Specific Question:   Family History of Neural Tube Defects    Answer:   N    Order Specific Question:   Prior Pregnancy with Down Syndrome    Answer:   N    Order Specific Question:   Please give the patient's weight (in pounds)    Answer:   173  . Cystic Fibrosis Mutation 97  . Obstetric Panel, Including HIV  . Hemoglobin A1c  . POCT urinalysis  dipstick    Follow up in 4 weeks. 50% of  min visit spent on counseling and coordination of care.

## 2016-05-07 NOTE — Patient Instructions (Signed)
First Trimester of Pregnancy  The first trimester of pregnancy is from week 1 until the end of week 12 (months 1 through 3). A week after a sperm fertilizes an egg, the egg will implant on the wall of the uterus. This embryo will begin to develop into a baby. Genes from you and your partner are forming the baby. The female genes determine whether the baby is a boy or a girl. At 6-8 weeks, the eyes and face are formed, and the heartbeat can be seen on ultrasound. At the end of 12 weeks, all the baby's organs are formed.   Now that you are pregnant, you will want to do everything you can to have a healthy baby. Two of the most important things are to get good prenatal care and to follow your health care provider's instructions. Prenatal care is all the medical care you receive before the baby's birth. This care will help prevent, find, and treat any problems during the pregnancy and childbirth.  BODY CHANGES  Your body goes through many changes during pregnancy. The changes vary from woman to woman.   · You may gain or lose a couple of pounds at first.  · You may feel sick to your stomach (nauseous) and throw up (vomit). If the vomiting is uncontrollable, call your health care provider.  · You may tire easily.  · You may develop headaches that can be relieved by medicines approved by your health care provider.  · You may urinate more often. Painful urination may mean you have a bladder infection.  · You may develop heartburn as a result of your pregnancy.  · You may develop constipation because certain hormones are causing the muscles that push waste through your intestines to slow down.  · You may develop hemorrhoids or swollen, bulging veins (varicose veins).  · Your breasts may begin to grow larger and become tender. Your nipples may stick out more, and the tissue that surrounds them (areola) may become darker.  · Your gums may bleed and may be sensitive to brushing and flossing.   · Dark spots or blotches (chloasma, mask of pregnancy) may develop on your face. This will likely fade after the baby is born.  · Your menstrual periods will stop.  · You may have a loss of appetite.  · You may develop cravings for certain kinds of food.  · You may have changes in your emotions from day to day, such as being excited to be pregnant or being concerned that something may go wrong with the pregnancy and baby.  · You may have more vivid and strange dreams.  · You may have changes in your hair. These can include thickening of your hair, rapid growth, and changes in texture. Some women also have hair loss during or after pregnancy, or hair that feels dry or thin. Your hair will most likely return to normal after your baby is born.  WHAT TO EXPECT AT YOUR PRENATAL VISITS  During a routine prenatal visit:  · You will be weighed to make sure you and the baby are growing normally.  · Your blood pressure will be taken.  · Your abdomen will be measured to track your baby's growth.  · The fetal heartbeat will be listened to starting around week 10 or 12 of your pregnancy.  · Test results from any previous visits will be discussed.  Your health care provider may ask you:  · How you are feeling.  · If you   are feeling the baby move.  · If you have had any abnormal symptoms, such as leaking fluid, bleeding, severe headaches, or abdominal cramping.  · If you are using any tobacco products, including cigarettes, chewing tobacco, and electronic cigarettes.  · If you have any questions.  Other tests that may be performed during your first trimester include:  · Blood tests to find your blood type and to check for the presence of any previous infections. They will also be used to check for low iron levels (anemia) and Rh antibodies. Later in the pregnancy, blood tests for diabetes will be done along with other tests if problems develop.  · Urine tests to check for infections, diabetes, or protein in the urine.   · An ultrasound to confirm the proper growth and development of the baby.  · An amniocentesis to check for possible genetic problems.  · Fetal screens for spina bifida and Down syndrome.  · You may need other tests to make sure you and the baby are doing well.  · HIV (human immunodeficiency virus) testing. Routine prenatal testing includes screening for HIV, unless you choose not to have this test.  HOME CARE INSTRUCTIONS   Medicines  · Follow your health care provider's instructions regarding medicine use. Specific medicines may be either safe or unsafe to take during pregnancy.  · Take your prenatal vitamins as directed.  · If you develop constipation, try taking a stool softener if your health care provider approves.  Diet  · Eat regular, well-balanced meals. Choose a variety of foods, such as meat or vegetable-based protein, fish, milk and low-fat dairy products, vegetables, fruits, and whole grain breads and cereals. Your health care provider will help you determine the amount of weight gain that is right for you.  · Avoid raw meat and uncooked cheese. These carry germs that can cause birth defects in the baby.  · Eating four or five small meals rather than three large meals a day may help relieve nausea and vomiting. If you start to feel nauseous, eating a few soda crackers can be helpful. Drinking liquids between meals instead of during meals also seems to help nausea and vomiting.  · If you develop constipation, eat more high-fiber foods, such as fresh vegetables or fruit and whole grains. Drink enough fluids to keep your urine clear or pale yellow.  Activity and Exercise  · Exercise only as directed by your health care provider. Exercising will help you:    Control your weight.    Stay in shape.    Be prepared for labor and delivery.  · Experiencing pain or cramping in the lower abdomen or low back is a good sign that you should stop exercising. Check with your health care provider  before continuing normal exercises.  · Try to avoid standing for long periods of time. Move your legs often if you must stand in one place for a long time.  · Avoid heavy lifting.  · Wear low-heeled shoes, and practice good posture.  · You may continue to have sex unless your health care provider directs you otherwise.  Relief of Pain or Discomfort  · Wear a good support bra for breast tenderness.    · Take warm sitz baths to soothe any pain or discomfort caused by hemorrhoids. Use hemorrhoid cream if your health care provider approves.    · Rest with your legs elevated if you have leg cramps or low back pain.  · If you develop varicose veins in your   legs, wear support hose. Elevate your feet for 15 minutes, 3-4 times a day. Limit salt in your diet.  Prenatal Care  · Schedule your prenatal visits by the twelfth week of pregnancy. They are usually scheduled monthly at first, then more often in the last 2 months before delivery.  · Write down your questions. Take them to your prenatal visits.  · Keep all your prenatal visits as directed by your health care provider.  Safety  · Wear your seat belt at all times when driving.  · Make a list of emergency phone numbers, including numbers for family, friends, the hospital, and police and fire departments.  General Tips  · Ask your health care provider for a referral to a local prenatal education class. Begin classes no later than at the beginning of month 6 of your pregnancy.  · Ask for help if you have counseling or nutritional needs during pregnancy. Your health care provider can offer advice or refer you to specialists for help with various needs.  · Do not use hot tubs, steam rooms, or saunas.  · Do not douche or use tampons or scented sanitary pads.  · Do not cross your legs for long periods of time.  · Avoid cat litter boxes and soil used by cats. These carry germs that can cause birth defects in the baby and possibly loss of the fetus by miscarriage or stillbirth.   · Avoid all smoking, herbs, alcohol, and medicines not prescribed by your health care provider. Chemicals in these affect the formation and growth of the baby.  · Do not use any tobacco products, including cigarettes, chewing tobacco, and electronic cigarettes. If you need help quitting, ask your health care provider. You may receive counseling support and other resources to help you quit.  · Schedule a dentist appointment. At home, brush your teeth with a soft toothbrush and be gentle when you floss.  SEEK MEDICAL CARE IF:   · You have dizziness.  · You have mild pelvic cramps, pelvic pressure, or nagging pain in the abdominal area.  · You have persistent nausea, vomiting, or diarrhea.  · You have a bad smelling vaginal discharge.  · You have pain with urination.  · You notice increased swelling in your face, hands, legs, or ankles.  SEEK IMMEDIATE MEDICAL CARE IF:   · You have a fever.  · You are leaking fluid from your vagina.  · You have spotting or bleeding from your vagina.  · You have severe abdominal cramping or pain.  · You have rapid weight gain or loss.  · You vomit blood or material that looks like coffee grounds.  · You are exposed to German measles and have never had them.  · You are exposed to fifth disease or chickenpox.  · You develop a severe headache.  · You have shortness of breath.  · You have any kind of trauma, such as from a fall or a car accident.     This information is not intended to replace advice given to you by your health care provider. Make sure you discuss any questions you have with your health care provider.     Document Released: 02/25/2001 Document Revised: 03/24/2014 Document Reviewed: 01/11/2013  Elsevier Interactive Patient Education ©2017 [employer]

## 2016-05-08 LAB — CERVICOVAGINAL ANCILLARY ONLY
Bacterial vaginitis: NEGATIVE
CANDIDA VAGINITIS: NEGATIVE
CHLAMYDIA, DNA PROBE: NEGATIVE
NEISSERIA GONORRHEA: NEGATIVE
TRICH (WINDOWPATH): NEGATIVE

## 2016-05-09 LAB — URINE CULTURE, OB REFLEX

## 2016-05-09 LAB — CULTURE, OB URINE

## 2016-05-14 ENCOUNTER — Other Ambulatory Visit: Payer: Self-pay | Admitting: Certified Nurse Midwife

## 2016-05-14 DIAGNOSIS — Z3480 Encounter for supervision of other normal pregnancy, unspecified trimester: Secondary | ICD-10-CM

## 2016-05-14 LAB — OBSTETRIC PANEL, INCLUDING HIV
Antibody Screen: NEGATIVE
BASOS: 0 %
Basophils Absolute: 0 10*3/uL (ref 0.0–0.2)
EOS (ABSOLUTE): 0.3 10*3/uL (ref 0.0–0.4)
EOS: 2 %
HEMOGLOBIN: 12.5 g/dL (ref 11.1–15.9)
HIV Screen 4th Generation wRfx: NONREACTIVE
Hematocrit: 36.4 % (ref 34.0–46.6)
Hepatitis B Surface Ag: NEGATIVE
IMMATURE GRANULOCYTES: 0 %
Immature Grans (Abs): 0 10*3/uL (ref 0.0–0.1)
LYMPHS ABS: 4.1 10*3/uL — AB (ref 0.7–3.1)
LYMPHS: 24 %
MCH: 30.1 pg (ref 26.6–33.0)
MCHC: 34.3 g/dL (ref 31.5–35.7)
MCV: 88 fL (ref 79–97)
MONOS ABS: 1.2 10*3/uL — AB (ref 0.1–0.9)
Monocytes: 7 %
NEUTROS PCT: 67 %
Neutrophils Absolute: 11.6 10*3/uL — ABNORMAL HIGH (ref 1.4–7.0)
Platelets: 347 10*3/uL (ref 150–379)
RBC: 4.15 x10E6/uL (ref 3.77–5.28)
RDW: 14.6 % (ref 12.3–15.4)
RH TYPE: POSITIVE
RPR Ser Ql: NONREACTIVE
Rubella Antibodies, IGG: 4.31 index (ref >0.99)
WBC: 17.3 10*3/uL — AB (ref 3.4–10.8)

## 2016-05-14 LAB — HEMOGLOBIN A1C
Est. average glucose Bld gHb Est-mCnc: 105 mg/dL
HEMOGLOBIN A1C: 5.3 % (ref 4.8–5.6)

## 2016-05-14 LAB — MATERNIT21 PLUS CORE+SCA
Chromosome 13: NEGATIVE
Chromosome 18: NEGATIVE
Chromosome 21: NEGATIVE
Y Chromosome: NOT DETECTED

## 2016-05-14 LAB — HEMOGLOBINOPATHY EVALUATION
HEMOGLOBIN A2 QUANTITATION: 2.7 % (ref 1.8–3.2)
HEMOGLOBIN F QUANTITATION: 0 % (ref 0.0–2.0)
HGB C: 0 %
HGB S: 0 %
HGB VARIANT: 0 %
Hgb A: 97.3 % (ref 96.4–98.8)

## 2016-05-14 LAB — VITAMIN D 25 HYDROXY (VIT D DEFICIENCY, FRACTURES): Vit D, 25-Hydroxy: 14.7 ng/mL — ABNORMAL LOW (ref 30.0–100.0)

## 2016-05-14 LAB — CYSTIC FIBROSIS MUTATION 97: Interpretation: NOT DETECTED

## 2016-05-14 LAB — VARICELLA ZOSTER ANTIBODY, IGG: Varicella zoster IgG: 2532 index (ref >165)

## 2016-05-15 ENCOUNTER — Other Ambulatory Visit: Payer: Self-pay | Admitting: Certified Nurse Midwife

## 2016-05-15 DIAGNOSIS — R7989 Other specified abnormal findings of blood chemistry: Secondary | ICD-10-CM

## 2016-05-15 DIAGNOSIS — Z3480 Encounter for supervision of other normal pregnancy, unspecified trimester: Secondary | ICD-10-CM

## 2016-05-15 MED ORDER — VITAMIN D (ERGOCALCIFEROL) 1.25 MG (50000 UNIT) PO CAPS: 50000.0000 [IU] | ORAL_CAPSULE | ORAL | 2 refills | Status: DC

## 2016-05-23 ENCOUNTER — Inpatient Hospital Stay (HOSPITAL_COMMUNITY)
Admission: AD | Admit: 2016-05-23 | Discharge: 2016-05-23 | Disposition: A | Discharge status: Home / Self Care | Payer: BLUE CROSS/BLUE SHIELD | Source: Ambulatory Visit | Attending: Obstetrics & Gynecology | Admitting: Obstetrics & Gynecology

## 2016-05-23 ENCOUNTER — Encounter (HOSPITAL_COMMUNITY): Payer: Self-pay | Admitting: *Deleted

## 2016-05-23 DIAGNOSIS — M545 Low back pain: Secondary | ICD-10-CM | POA: Diagnosis not present

## 2016-05-23 DIAGNOSIS — R1030 Lower abdominal pain, unspecified: Secondary | ICD-10-CM | POA: Diagnosis not present

## 2016-05-23 DIAGNOSIS — M549 Dorsalgia, unspecified: Secondary | ICD-10-CM | POA: Diagnosis not present

## 2016-05-23 DIAGNOSIS — R102 Pelvic and perineal pain: Secondary | ICD-10-CM

## 2016-05-23 DIAGNOSIS — Z87891 Personal history of nicotine dependence: Secondary | ICD-10-CM | POA: Insufficient documentation

## 2016-05-23 DIAGNOSIS — Z3A14 14 weeks gestation of pregnancy: Secondary | ICD-10-CM | POA: Diagnosis not present

## 2016-05-23 DIAGNOSIS — Z79899 Other long term (current) drug therapy: Secondary | ICD-10-CM | POA: Diagnosis not present

## 2016-05-23 DIAGNOSIS — O9989 Other specified diseases and conditions complicating pregnancy, childbirth and the puerperium: Secondary | ICD-10-CM

## 2016-05-23 DIAGNOSIS — O26899 Other specified pregnancy related conditions, unspecified trimester: Secondary | ICD-10-CM

## 2016-05-23 DIAGNOSIS — O26892 Other specified pregnancy related conditions, second trimester: Secondary | ICD-10-CM | POA: Diagnosis not present

## 2016-05-23 LAB — URINALYSIS, ROUTINE W REFLEX MICROSCOPIC
Bilirubin Urine: NEGATIVE
GLUCOSE, UA: NEGATIVE mg/dL
HGB URINE DIPSTICK: NEGATIVE
Ketones, ur: NEGATIVE mg/dL
Leukocytes, UA: NEGATIVE
Nitrite: NEGATIVE
Protein, ur: NEGATIVE mg/dL
SPECIFIC GRAVITY, URINE: 1.026 (ref 1.005–1.030)
pH: 5 (ref 5.0–8.0)

## 2016-05-23 MED ORDER — IBUPROFEN 600 MG PO TABS
600.0000 mg | ORAL_TABLET | Freq: Once | ORAL | Status: AC
Start: 2016-05-23 — End: 2016-05-23
  Administered 2016-05-23: 600 mg via ORAL
  Filled 2016-05-23: qty 1

## 2016-05-23 MED ORDER — COMFORT FIT MATERNITY SUPP SM MISC: 1.0000 [IU] | Freq: Every day | 0 refills | Status: DC | PRN

## 2016-05-23 MED ORDER — CYCLOBENZAPRINE HCL 10 MG PO TABS: 10.0000 mg | ORAL_TABLET | Freq: Three times a day (TID) | ORAL | 0 refills | Status: DC | PRN

## 2016-05-23 NOTE — MAU Note (Signed)
Middle of my back is swollen up and down my spine for 2 days. Tender to touch.Worse with bending over. L groin sore which is worse with movement. Denies vag bleeding or d/c.

## 2016-05-23 NOTE — MAU Provider Note (Signed)
History     CSN: [id number]  Arrival date and time: 05/23/16 2122   First Provider Initiated Contact with Patient 05/23/16 2200      Chief Complaint  Patient presents with  . Back Pain   Krystine Gretta BeganJ Christoph is a 31 y.o. G3P1011 at 3427w1d who presents with back pain & groin pain. Symptoms began 2 days ago. 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 Thinks her pain is due to work.    Back Pain  This is a new problem. The current episode started in the past 7 days. The problem occurs constantly. The problem is unchanged. The pain is present in the lumbar spine and thoracic spine. The quality of the pain is described as aching. The pain does not radiate. The pain is at a severity of 7/10. The symptoms are aggravated by bending and standing. Pertinent negatives include no abdominal pain, bladder incontinence, bowel incontinence, dysuria, fever, leg pain, numbness, paresthesias or tingling. Risk factors include obesity, pregnancy and lack of exercise. Treatments tried: tylenol. The treatment provided no relief.   OB History    Gravida Para Term Preterm AB Living   3 1 1  0 1 1   SAB TAB Ectopic Multiple Live Births   1 0 0 0 1      Past Medical History:  Diagnosis Date  . Abortion, nontherapeutic 2004   Elective at 18 weeks   . Nausea    mild in the pastt several weeks   . Strep throat    recurrent approx. 3 times in the past year   . Tonsillitis 2011   Severe - [place]osptalised in overnight in [place]     Past Surgical History:  Procedure Laterality Date  . CESAREAN SECTION    . INDUCED ABORTION  2004   at 18 weeks (elective )  . MOUTH SURGERY      Family History  Problem Relation Age of Onset  . Cancer Mother 5550    bone marrow     Social History  Substance Use Topics  . Smoking status: Former Games developermoker  . Smokeless tobacco: Never Used  . Alcohol use No    Allergies: No Known Allergies  Prescriptions Prior to Admission   Medication Sig Dispense Refill Last Dose  . acetaminophen (TYLENOL) 325 MG tablet Take 650 mg by mouth every 6 (six) hours as needed.   05/23/2016 at Unknown time  . Doxylamine-Pyridoxine (DICLEGIS) 10-10 MG TBEC Take 1 tablet with breakfast and lunch.  Take 2 tablets at bedtime. 100 tablet 4 Past Week at Unknown time  . Prenatal Vit-Fe Fumarate-FA (PRENATAL MULTIVITAMIN) TABS tablet Take 1 tablet by mouth daily at 12 noon.   05/23/2016 at Unknown time  . Vitamin D, Ergocalciferol, (DRISDOL) 50000 units CAPS capsule Take 1 capsule (50,000 Units total) by mouth every 7 (seven) days. 30 capsule 2 Past Week at Unknown time  . metroNIDAZOLE (METROGEL VAGINAL) 0.75 % vaginal gel Place 1 Applicatorful vaginally 2 (two) times daily. (Patient not taking: Reported on 05/07/2016) 70 g 0 Not Taking    Review of Systems  Constitutional: Negative for chills and fever.  Gastrointestinal: Negative.  Negative for abdominal pain and bowel incontinence.  Genitourinary: Negative.  Negative for bladder incontinence and dysuria.  Musculoskeletal: Positive for back pain. Negative for joint swelling and neck pain.  Neurological: Negative for tingling, numbness and paresthesias.   Physical Exam   Blood pressure 108/57, pulse 85,  temperature 98.6 F (37 C), resp. rate 16, height 5' 3.5" (1.613 m), weight 173 lb 6.4 oz (78.7 kg), last menstrual period 02/20/2016.  Physical Exam  Nursing note and vitals reviewed. Constitutional: She is oriented to person, place, and time. She appears well-developed and well-nourished. No distress.  HENT:  Head: Normocephalic and atraumatic.  Eyes: Conjunctivae are normal. Right eye exhibits no discharge. Left eye exhibits no discharge. No scleral icterus.  Neck: Normal range of motion.  Cardiovascular: Normal rate, regular rhythm and normal heart sounds.   No murmur heard. Respiratory: Effort normal and breath sounds normal. No respiratory distress. She has no wheezes.  GI: Soft.  Bowel sounds are normal. There is no tenderness. There is no CVA tenderness.  Musculoskeletal: Normal range of motion.       Thoracic back: She exhibits tenderness and pain. She exhibits no swelling and no edema.       Lumbar back: Normal.  Lymphadenopathy:       Right: No inguinal adenopathy present.       Left: No inguinal adenopathy present.  Neurological: She is alert and oriented to person, place, and time.  Skin: Skin is warm and dry. She is not diaphoretic.  Psychiatric: She has a normal mood and affect. Her behavior is normal. Judgment and thought content normal.    MAU Course  Procedures Results for orders placed or performed during the hospital encounter of 05/23/16 (from the past 24 hour(s))  Urinalysis, Routine w reflex microscopic     Status: None   Collection Time: 05/23/16  9:40 PM  Result Value Ref Range   Color, Urine YELLOW YELLOW   APPearance CLEAR CLEAR   Specific Gravity, Urine 1.026 1.005 - 1.030   pH 5.0 5.0 - 8.0   Glucose, UA NEGATIVE NEGATIVE mg/dL   Hgb urine dipstick NEGATIVE NEGATIVE   Bilirubin Urine NEGATIVE NEGATIVE   Ketones, ur NEGATIVE NEGATIVE mg/dL   Protein, ur NEGATIVE NEGATIVE mg/dL   Nitrite NEGATIVE NEGATIVE   Leukocytes, UA NEGATIVE NEGATIVE    MDM FHT 155 by doppler VSS, NAD Ibuprofen 600 mg PO, hot packs to affected area Pain down 7>1 prior to discharge Assessment and Plan  A:  1. Back pain affecting pregnancy in second trimester   2. Pain of round ligament during pregnancy    P: Discharge home Rx maternity support belt & flexeril Discussed reasons to return to MAU Keep follow up appointment with OB/PCP   Judeth Horn 05/23/2016, 9:57 PM

## 2016-05-23 NOTE — Progress Notes (Signed)
ERin Lawrence NP in earlier to discuss d/c plan. Written and verbal d/c instructions given and understanding voiced. ?

## 2016-05-23 NOTE — Discharge Instructions (Signed)
Round Ligament Pain The round ligament is a cord of muscle and tissue that helps to support the uterus. It can become a source of pain during pregnancy if it becomes stretched or twisted as the baby grows. The pain usually begins in the second trimester of pregnancy, and it can come and go until the baby is delivered. It is not a serious problem, and it does not cause harm to the baby. Round ligament pain is usually a short, sharp, and pinching pain, but it can also be a dull, lingering, and aching pain. The pain is felt in the lower side of the abdomen or in the groin. It usually starts deep in the groin and moves up to the outside of the hip area. Pain can occur with:  A sudden change in position.  Rolling over in bed.  Coughing or sneezing.  Physical activity. Follow these instructions at home: Watch your condition for any changes. Take these steps to help with your pain:  When the pain starts, relax. Then try:  Sitting down.  Flexing your knees up to your abdomen.  Lying on your side with one pillow under your abdomen and another pillow between your legs.  Sitting in a warm bath for 15-20 minutes or until the pain goes away.  Take over-the-counter and prescription medicines only as told by your health care provider.  Move slowly when you sit and stand.  Avoid long walks if they cause pain.  Stop or lessen your physical activities if they cause pain. Contact a health care provider if:  Your pain does not go away with treatment.  You feel pain in your back that you did not have before.  Your medicine is not helping. Get help right away if:  You develop a fever or chills.  You develop uterine contractions.  You develop vaginal bleeding.  You develop nausea or vomiting.  You develop diarrhea.  You have pain when you urinate. This information is not intended to replace advice given to you by your health care provider. Make sure you discuss any questions you have with  your health care provider. Document Released: 12/11/2007 Document Revised: 08/09/2015 Document Reviewed: 05/10/2014 Elsevier Interactive Patient Education  2017 [employer] Back Pain in Pregnancy Back pain during pregnancy is common. Back pain may be caused by several factors that are related to changes during your pregnancy. Follow these instructions at home: Managing pain, stiffness, and swelling   If directed, apply ice for sudden (acute) back pain.  Put ice in a plastic bag.  Place a towel between your skin and the bag.  Leave the ice on for 20 minutes, 2-3 times per day.  If directed, apply heat to the affected area before you exercise:  Place a towel between your skin and the heat pack or heating pad.  Leave the heat on for 20-30 minutes.  Remove the heat if your skin turns bright red. This is especially important if you are unable to feel pain, heat, or cold. You may have a greater risk of getting burned. Activity   Exercise as told by your health care provider. Exercising is the best way to prevent or manage back pain.  Listen to your body when lifting. If lifting hurts, ask for help or bend your knees. This uses your leg muscles instead of your back muscles.  Squat down when picking up something from the floor. Do not bend over.  Only use bed rest as told by your health care provider. Bed  rest should only be used for the most severe episodes of back pain. Standing, Sitting, and Lying Down   Do not stand in one place for long periods of time.  Use good posture when sitting. Make sure your head rests over your shoulders and is not hanging forward. Use a pillow on your lower back if necessary.  Try sleeping on your side, preferably the left side, with a pillow or two between your legs. If you are sore after a night's rest, your bed may be too soft. A firm mattress may provide more support for your back during pregnancy. General instructions   Do not wear high  heels.  Eat a healthy diet. Try to gain weight within your health care provider's recommendations.  Use a maternity girdle, elastic sling, or back brace as told by your health care provider.  Take over-the-counter and prescription medicines only as told by your health care provider.  Keep all follow-up visits as told by your health care provider. This is important. This includes any visits with any specialists, such as a physical therapist. Contact a health care provider if:  Your back pain interferes with your daily activities.  You have increasing pain in other parts of your body. Get help right away if:  You develop numbness, tingling, weakness, or problems with the use of your arms or legs.  You develop severe back pain that is not controlled with medicine.  You have a sudden change in bowel or bladder control.  You develop shortness of breath, dizziness, or you faint.  You develop nausea, vomiting, or sweating.  You have back pain that is a rhythmic, cramping pain similar to labor pains. Labor pain is usually 1-2 minutes apart, lasts for about 1 minute, and involves a bearing down feeling or pressure in your pelvis.  You have back pain and your water breaks or you have vaginal bleeding.  You have back pain or numbness that travels down your leg.  Your back pain developed after you fell.  You develop pain on one side of your back.  You see blood in your urine.  You develop skin blisters in the area of your back pain. This information is not intended to replace advice given to you by your health care provider. Make sure you discuss any questions you have with your health care provider. Document Released: 06/11/2005 Document Revised: 08/09/2015 Document Reviewed: 11/15/2014 Elsevier Interactive Patient Education  2017 [employer]

## 2016-06-04 ENCOUNTER — Encounter: Payer: Self-pay | Admitting: Certified Nurse Midwife

## 2016-06-04 ENCOUNTER — Ambulatory Visit (INDEPENDENT_AMBULATORY_CARE_PROVIDER_SITE_OTHER): Payer: BLUE CROSS/BLUE SHIELD | Admitting: Certified Nurse Midwife

## 2016-06-04 VITALS — BP 98/64 | HR 83 | Wt 173.7 lb

## 2016-06-04 DIAGNOSIS — Z3480 Encounter for supervision of other normal pregnancy, unspecified trimester: Secondary | ICD-10-CM | POA: Diagnosis not present

## 2016-06-04 DIAGNOSIS — Z3482 Encounter for supervision of other normal pregnancy, second trimester: Secondary | ICD-10-CM

## 2016-06-04 DIAGNOSIS — R7989 Other specified abnormal findings of blood chemistry: Secondary | ICD-10-CM

## 2016-06-04 DIAGNOSIS — E559 Vitamin D deficiency, unspecified: Secondary | ICD-10-CM

## 2016-06-04 NOTE — Progress Notes (Signed)
   PRENATAL VISIT NOTE  Subjective:  Emma Carlson is a 31 y.o. G3P1011 at 2663w6d being seen today for ongoing prenatal care.  She is currently monitored for the following issues for this low-risk pregnancy and has Supervision of normal pregnancy, antepartum and Low vitamin D level on her problem list.  Patient reports no complaints.  Contractions: Not present. Vag. Bleeding: None.   . Denies leaking of fluid.   The following portions of the patient's history were reviewed and updated as appropriate: allergies, current medications, past family history, past medical history, past social history, past surgical history and problem list. Problem list updated.  Objective:   Vitals:   06/04/16 1000  BP: 98/64  Pulse: 83  Weight: 173 lb 11.2 oz (78.8 kg)    Fetal Status: Fetal Heart Rate (bpm): 150         General:  Alert, oriented and cooperative. Patient is in no acute distress.  Skin: Skin is warm and dry. No rash noted.   Cardiovascular: Normal heart rate noted  Respiratory: Normal respiratory effort, no problems with respiration noted  Abdomen: Soft, gravid, appropriate for gestational age. Pain/Pressure: Absent     Pelvic:  Cervical exam deferred        Extremities: Normal range of motion.  Edema: Trace  Mental Status: Normal mood and affect. Normal behavior. Normal judgment and thought content.   Assessment and Plan:  Pregnancy: G3P1011 at 4763w6d  1. Supervision of other normal pregnancy, antepartum   Doing well - AFP, Serum, Open Spina Bifida - US MFM OB COMP + 14 WK; Future  2. Low vitamin D level Has RX for weekly vitamin D supplementation.   Preterm labor symptoms and general obstetric precautions including but not limited to vaginal bleeding, contractions, leaking of fluid and fetal movement were reviewed in detail with the patient. Please refer to After Visit Summary for other counseling recommendations.  Return for babyscripts.   Roe Coombsachelle A Denney, CNM

## 2016-06-04 NOTE — Progress Notes (Signed)
Patient states that she has started to feel fetal flutter movements. 

## 2016-06-10 ENCOUNTER — Other Ambulatory Visit: Payer: Self-pay | Admitting: Certified Nurse Midwife

## 2016-06-10 DIAGNOSIS — Z3480 Encounter for supervision of other normal pregnancy, unspecified trimester: Secondary | ICD-10-CM

## 2016-06-10 LAB — AFP, SERUM, OPEN SPINA BIFIDA
AFP MoM: 1.46
AFP Value: 47.2 ng/mL
GEST. AGE ON COLLECTION DATE: 15.9 wk
Maternal Age At EDD: 32.8 years
OSBR RISK 1 IN: 6095
TEST RESULTS AFP: NEGATIVE
Weight: 173 [lb_av]

## 2016-06-26 ENCOUNTER — Other Ambulatory Visit: Payer: Self-pay | Admitting: Certified Nurse Midwife

## 2016-06-26 ENCOUNTER — Ambulatory Visit (HOSPITAL_COMMUNITY)
Admission: RE | Admit: 2016-06-26 | Discharge: 2016-06-26 | Disposition: A | Discharge status: Home / Self Care | Payer: BLUE CROSS/BLUE SHIELD | Source: Ambulatory Visit | Attending: Certified Nurse Midwife | Admitting: Certified Nurse Midwife

## 2016-06-26 ENCOUNTER — Ambulatory Visit (INDEPENDENT_AMBULATORY_CARE_PROVIDER_SITE_OTHER): Payer: BLUE CROSS/BLUE SHIELD | Admitting: Obstetrics & Gynecology

## 2016-06-26 VITALS — BP 119/74 | HR 94 | Wt 179.5 lb

## 2016-06-26 DIAGNOSIS — Z3480 Encounter for supervision of other normal pregnancy, unspecified trimester: Secondary | ICD-10-CM

## 2016-06-26 DIAGNOSIS — O34219 Maternal care for unspecified type scar from previous cesarean delivery: Secondary | ICD-10-CM | POA: Insufficient documentation

## 2016-06-26 DIAGNOSIS — O3412 Maternal care for benign tumor of corpus uteri, second trimester: Secondary | ICD-10-CM | POA: Insufficient documentation

## 2016-06-26 DIAGNOSIS — O26892 Other specified pregnancy related conditions, second trimester: Secondary | ICD-10-CM

## 2016-06-26 DIAGNOSIS — Z3482 Encounter for supervision of other normal pregnancy, second trimester: Secondary | ICD-10-CM

## 2016-06-26 DIAGNOSIS — O9989 Other specified diseases and conditions complicating pregnancy, childbirth and the puerperium: Secondary | ICD-10-CM

## 2016-06-26 DIAGNOSIS — Z3A19 19 weeks gestation of pregnancy: Secondary | ICD-10-CM | POA: Insufficient documentation

## 2016-06-26 DIAGNOSIS — Z363 Encounter for antenatal screening for malformations: Secondary | ICD-10-CM

## 2016-06-26 DIAGNOSIS — M549 Dorsalgia, unspecified: Secondary | ICD-10-CM

## 2016-06-26 NOTE — Progress Notes (Signed)
PRENATAL VISIT NOTE  Subjective:  Emma Carlson is a 31 y.o. G3P1011 at [redacted]w[redacted]d being seen today for ongoing prenatal care.  She is currently monitored for the following issues for this low-risk pregnancy and has Supervision of normal pregnancy, antepartum; Low vitamin D level; and Back pain affecting pregnancy in second trimester on her problem list.  Patient reports backache, taking medications as previously prescribed and using back brace.  Contractions: Not present. Vag. Bleeding: None.  Movement: Present. Denies leaking of fluid.   The following portions of the patient's history were reviewed and updated as appropriate: allergies, current medications, past family history, past medical history, past social history, past surgical history and problem list. Problem list updated.  Objective:   Vitals:   06/26/16 1334  BP: 119/74  Pulse: 94  Weight: 179 lb 8 oz (81.4 kg)    Fetal Status: Fetal Heart Rate (bpm): 145 Fundal Height: 19 cm Movement: Present     General:  Alert, oriented and cooperative. Patient is in no acute distress.  Skin: Skin is warm and dry. No rash noted.   Cardiovascular: Normal heart rate noted  Respiratory: Normal respiratory effort, no problems with respiration noted  Abdomen: Soft, gravid, appropriate for gestational age. Pain/Pressure: Absent     Pelvic:  Cervical exam deferred        Extremities: Normal range of motion.  Edema: Trace  Mental Status: Normal mood and affect. Normal behavior. Normal judgment and thought content.   [place] Mfm Ob Comp + 14 Wk  Result Date: 06/26/2016 ----------------------------------------------------------------------  OBSTETRICS REPORT                      (Signed Final 06/26/2016 10:38 am) ---------------------------------------------------------------------- Patient Info  ID #:       [id number]                          D.O.B.:  Jun 13, 1985 (30 yrs)  Name:       Emma Carlson                 Visit Date: 06/26/2016 09:19 am  ---------------------------------------------------------------------- Performed By  Performed By:     Tommie Raymond BS,       Ref. Address:     [address]                                                             [address]                                                             [place]  [address]  Attending:        Erle Crocker MD     Location:         [facility]  Referred By:      Roe Coombs CNM ---------------------------------------------------------------------- Orders   #  Description                                 Code   1  [place] MFM OB COMP + 14 WK                      76805.01  ----------------------------------------------------------------------   #  Ordered By               Order #        Accession #    Episode #   1  RACHELLE Marjo Bicker          [id number]      [id number]     [id number]  ---------------------------------------------------------------------- Indications   [redacted] weeks gestation of pregnancy                Z3A.19   Encounter for antenatal screening for          Z36.3   malformations   History of cesarean delivery, currently        O34.219   pregnant  ---------------------------------------------------------------------- OB History  Blood Type:            Height:  5'4"   Weight (lb):  173       BMI:  29.69  Gravidity:    4         Term:   1        Prem:   0        SAB:   1  TOP:          1       Ectopic:  0        Living: 1 ---------------------------------------------------------------------- Fetal Evaluation  Num Of Fetuses:     1  Fetal Heart         145  Rate(bpm):  Cardiac Activity:   Observed  Presentation:       Cephalic  Placenta:           Anterior, above cervical os  P. Cord Insertion:  Visualized, central  Amniotic Fluid  AFI FV:      Subjectively within normal limits                              Largest Pocket(cm)                              5.7  ---------------------------------------------------------------------- Biometry  BPD:      44.5  mm     G. Age:  19w 3d         70  %    CI:        77.94   %    70 - 86  FL/HC:      17.6   %    16.1 - 18.3  HC:      159.5  mm     G. Age:  18w 6d         33  %    HC/AC:      1.11        1.09 - 1.39  AC:      143.5  mm     G. Age:  19w 5d         68  %    FL/BPD:     62.9   %  FL:         28  mm     G. Age:  18w 4d         29  %    FL/AC:      19.5   %    20 - 24  HUM:      27.7  mm     G. Age:  18w 6d         47  %  CER:      19.6  mm     G. Age:  18w 6d         44  %  NFT:       3.9  mm  CM:        5.3  mm  Est. FW:     276  gm    0 lb 10 oz      47  % ---------------------------------------------------------------------- Gestational Age  LMP:           18w 1d        Date:  02/20/16                 EDD:   11/26/16  U/S Today:     19w 1d                                        EDD:   11/19/16  Best:          19w 0d     Det. By:  Marcella Dubs         EDD:   11/20/16                                      (04/09/16) ---------------------------------------------------------------------- Anatomy  Cranium:               Appears normal         Aortic Arch:            Appears normal  Cavum:                 Appears normal         Ductal Arch:            Appears normal  Ventricles:            Appears normal         Diaphragm:              Appears normal  Choroid Plexus:        Appears normal         Stomach:  Appears normal, left                                                                        sided  Cerebellum:            Appears normal         Abdomen:                Appears normal  Posterior Fossa:       Appears normal         Abdominal Wall:         Appears nml (cord                                                                        insert, abd wall)  Nuchal Fold:           Appears normal         Cord Vessels:           Appears normal (3                                                                         vessel cord)  Face:                  Appears normal         Kidneys:                Appear normal                         (orbits and profile)  Lips:                  Appears normal         Bladder:                Appears normal  Thoracic:              Appears normal         Spine:                  Appears normal  Heart:                 Appears normal         Upper Extremities:      Appears normal                         (4CH, axis, and situs  RVOT:                  Appears normal         Lower Extremities:  Appears normal  LVOT:                  Appears normal  Other:  Fetus appears to be a female. Heels and 5th digit visualized. Nasal          bone visualized. ---------------------------------------------------------------------- Cervix Uterus Adnexa  Cervix  Length:            3.5  cm.  Normal appearance by transabdominal scan.  Uterus  Single fibroid noted, see table below.  Left Ovary  Size(cm)     2.14   x   2.87   x  2.28      Vol(ml): 7.3  Within normal limits. No adnexal mass visualized.  Right Ovary  Size(cm)     2.52   x   2.32   x  1.79      Vol(ml): 5.5  Within normal limits. No adnexal mass visualized.  Cul De Sac:   No free fluid seen.  Adnexa:       No abnormality visualized. ---------------------------------------------------------------------- Myomas   Site                     L(cm)      W(cm)      D(cm)      Location   Right                    0.9        0.8        0.8        Intramural  ----------------------------------------------------------------------   Blood Flow                 RI        PI       Comments  ---------------------------------------------------------------------- Impression  Indication: 31 yr old G3P1011 at [redacted]w[redacted]d for fetal anatomic  survey. Remote read.  Findings:  1. Single intrauterine pregnancy.  2. Fetal biometry is consistent with dating.  3. Anterior placenta without evidence of previa.  4. Normal  amniotic fluid volume.  5. Normal transabdominal cervical length.  6. There is a small uterine fibroid measuring 1cm.  7. Normal fetal anatomic survey. ---------------------------------------------------------------------- Recommendations  1. Appropriate fetal growth.  2. Normal fetal anatomic survey.  3. Low risk cell free fetal DNA and MSAFP  4. Fibroid:  - given small size unlikely to be clinically significant  5. Recommend follow up ultrasounds as clinically indicated ----------------------------------------------------------------------                Erle Crocker, MD Electronically Signed Final Report   06/26/2016 10:38 am ----------------------------------------------------------------------   Assessment and Plan:  Pregnancy: G3P1011 at [redacted]w[redacted]d  1. Back pain affecting pregnancy in second trimester Continue prescribed medications, back brace and work restrictions.  2. Supervision of other normal pregnancy, antepartum Normal anatomy scan today.  Preterm labor symptoms and general obstetric precautions including but not limited to vaginal bleeding, contractions, leaking of fluid and fetal movement were reviewed in detail with the patient. Please refer to After Visit Summary for other counseling recommendations.  Return in about 4 weeks (around 07/24/2016) for OB Visit.   Tereso Newcomer, MD

## 2016-06-26 NOTE — Patient Instructions (Signed)

## 2016-07-21 ENCOUNTER — Ambulatory Visit (INDEPENDENT_AMBULATORY_CARE_PROVIDER_SITE_OTHER): Payer: BLUE CROSS/BLUE SHIELD | Admitting: Obstetrics and Gynecology

## 2016-07-21 VITALS — BP 108/69 | HR 92 | Wt 182.0 lb

## 2016-07-21 DIAGNOSIS — Z3482 Encounter for supervision of other normal pregnancy, second trimester: Secondary | ICD-10-CM

## 2016-07-21 DIAGNOSIS — Z3480 Encounter for supervision of other normal pregnancy, unspecified trimester: Secondary | ICD-10-CM

## 2016-07-21 NOTE — Progress Notes (Signed)
   PRENATAL VISIT NOTE  Subjective:  Emma Carlson is a 31 y.o. G3P1011 at 2466w4d being seen today for ongoing prenatal care.  She is currently monitored for the following issues for this low-risk pregnancy and has Supervision of normal pregnancy, antepartum; Low vitamin D level; and Back pain affecting pregnancy in second trimester on her problem list.  Patient reports no complaints.  Contractions: Not present. Vag. Bleeding: None.  Movement: Present. Denies leaking of fluid.   The following portions of the patient's history were reviewed and updated as appropriate: allergies, current medications, past family history, past medical history, past social history, past surgical history and problem list. Problem list updated.  Objective:   Vitals:   07/21/16 1107  BP: 108/69  Pulse: 92  Weight: 182 lb (82.6 kg)    Fetal Status: Fetal Heart Rate (bpm): 141 Fundal Height: 22 cm Movement: Present     General:  Alert, oriented and cooperative. Patient is in no acute distress.  Skin: Skin is warm and dry. No rash noted.   Cardiovascular: Normal heart rate noted  Respiratory: Normal respiratory effort, no problems with respiration noted  Abdomen: Soft, gravid, appropriate for gestational age. Pain/Pressure: Absent     Pelvic:  Cervical exam deferred        Extremities: Normal range of motion.  Edema: Trace  Mental Status: Normal mood and affect. Normal behavior. Normal judgment and thought content.   Assessment and Plan:  Pregnancy: G3P1011 at 3866w4d  1. Supervision of other normal pregnancy, antepartum Patient is doing well Third trimester labs and glucola next visit Patient is leaving for a [place]-day trip to [place] later this week. Advised to stay well hydrated  Preterm labor symptoms and general obstetric precautions including but not limited to vaginal bleeding, contractions, leaking of fluid and fetal movement were reviewed in detail with the patient. Please refer to After Visit Summary  for other counseling recommendations.  Return in about 4 weeks (around 08/18/2016) for ROB, 2 hr glucola next visit.   Carlen Rebuck, Gigi GinPeggy, MD

## 2016-07-24 ENCOUNTER — Encounter: Payer: BLUE CROSS/BLUE SHIELD | Admitting: Obstetrics and Gynecology

## 2016-08-18 ENCOUNTER — Encounter: Payer: Self-pay | Admitting: Certified Nurse Midwife

## 2016-08-18 ENCOUNTER — Other Ambulatory Visit: Payer: BLUE CROSS/BLUE SHIELD

## 2016-08-18 ENCOUNTER — Ambulatory Visit (INDEPENDENT_AMBULATORY_CARE_PROVIDER_SITE_OTHER): Payer: BLUE CROSS/BLUE SHIELD | Admitting: Certified Nurse Midwife

## 2016-08-18 VITALS — BP 102/67 | HR 85 | Wt 186.7 lb

## 2016-08-18 DIAGNOSIS — Z3480 Encounter for supervision of other normal pregnancy, unspecified trimester: Secondary | ICD-10-CM | POA: Diagnosis not present

## 2016-08-18 DIAGNOSIS — Z3482 Encounter for supervision of other normal pregnancy, second trimester: Secondary | ICD-10-CM

## 2016-08-18 DIAGNOSIS — E559 Vitamin D deficiency, unspecified: Secondary | ICD-10-CM | POA: Diagnosis not present

## 2016-08-18 DIAGNOSIS — R7989 Other specified abnormal findings of blood chemistry: Secondary | ICD-10-CM

## 2016-08-18 NOTE — Addendum Note (Signed)
Addended by: Natale MilchSTALLING, Lukis Bunt D on: 08/18/2016 11:21 AM   Modules accepted: Orders

## 2016-08-18 NOTE — Progress Notes (Signed)
Patient reports good fetal movement, denies pain. 

## 2016-08-18 NOTE — Patient Instructions (Addendum)
AREA PEDIATRIC/FAMILY PRACTICE PHYSICIANS  [facility] [address], [address] [place], [place]  [address] Phone - [phone]   Fax - [phone]  [facility] [address] [place], [place] [address] Phone - [phone]   Fax - [phone]  JACK AMOS [address] [place], [place]  [address] Phone - [phone]   Fax - [phone]  [facility] [address] [place], [place]  [address] Phone - [phone]   Fax - [phone]  [facility] [address] [place], [place]  [address] Phone - [phone]   Fax - [phone]  [facility] [address], [address] [place], [place]  [address] Phone - [phone]   Fax - [phone]  [facility] [address] [place], [place]  [address] Phone - [phone]   Fax - [phone]  [facility] [address], [address] [place], [place]  [address] Phone - [phone]   Fax - [phone]  [facility] [address] [place], [place]  [address] Phone - [phone]   Fax - [phone] [facility] [address] [place], [place]  [address] Phone - [phone]   Fax - [phone]  [facility] [address] [place], [place]  [address] Phone - [phone]   Fax - [phone]  [facility] [address] [place], [place]  [address] Phone - [phone]   Fax - [phone]  [facility] [address], [address] [place], [place]  [address] Phone - [phone]   Fax - [phone]  SHILPA GOSRANI [address] [place], [place]  [address] Phone - [phone]  [facility] [address] [place], [place]  [address] Phone - [phone]   Fax - [phone]  [facility] [address] [place], [place]  [address] Phone - [phone]   Fax - [phone]  [facility] [address] [place], [place]  [address] Phone - [phone]   Fax - [phone]  [facility] [address] [place], [place]  [address] Phone - [phone]   Fax - [phone]   [facility] [address] [place], [place]  [address] Phone - [phone]   Fax - [phone]  [facility] [address] [place], [place]  [address] Phone - [phone]   Fax - [phone]  DAVID RUBIN [address] [place], [place]  [address] Phone - [phone]   Fax - [phone]  [facility] [address] [place], [place]  [address] Phone - [phone]   Fax - [phone]  [facility] [address] [place], [place]  [address] Phone - [phone]   Fax - [phone] [facility] [address] [place], [place]  [address] Phone - [phone]   Fax - [phone]  [facility] [address] [place], [place]  [address] Phone - [phone]   Fax - [phone]  [facility] [address] [place], [place]  [address] Phone - [phone]   Fax - [phone]  [facility] Charlene Flemming MD [address] [place]  [address] Phone [phone]  Fax [phone]  Contraception Choices Contraception (birth control) is the use of any methods or devices to prevent   pregnancy. Below are some methods to help avoid pregnancy. Hormonal methods  Contraceptive implant. This is a thin, plastic tube containing progesterone hormone. It does not contain estrogen hormone. Your health care provider inserts the tube in the inner part of the upper arm. The tube can remain in place for up to 3 years. After 3 years, the implant must be removed. The implant prevents the  ovaries from releasing an egg (ovulation), thickens the cervical mucus to prevent sperm from entering the uterus, and thins the lining of the inside of the uterus.  Progesterone-only injections. These injections are given every 3 months by your health care provider to prevent pregnancy. This synthetic progesterone hormone stops the ovaries from releasing eggs. It also thickens cervical mucus and changes the uterine lining. This makes it harder for sperm to survive in the uterus.  Birth control pills. These pills contain estrogen and progesterone hormone. They work by preventing the ovaries from releasing eggs (ovulation). They also cause the cervical mucus to thicken, preventing the sperm from entering the uterus. Birth control pills are prescribed by a health care provider.Birth control pills can also be used to treat heavy periods.  Minipill. This type of birth control pill contains only the progesterone hormone. They are taken every day of each month and must be prescribed by your health care provider.  Birth control patch. The patch contains hormones similar to those in birth control pills. It must be changed once a week and is prescribed by a health care provider.  Vaginal ring. The ring contains hormones similar to those in birth control pills. It is left in the vagina for 3 weeks, removed for 1 week, and then a new one is put back in place. The patient must be comfortable inserting and removing the ring from the vagina.A health care provider's prescription is necessary.  Emergency contraception. Emergency contraceptives prevent pregnancy after unprotected sexual intercourse. This pill can be taken right after sex or up to 5 days after unprotected sex. It is most effective the sooner you take the pills after having sexual intercourse. Most emergency contraceptive pills are available without a prescription. Check with your pharmacist. Do not use emergency contraception as your only form of birth  control. Barrier methods  Female condom. This is a thin sheath (latex or rubber) that is worn over the penis during sexual intercourse. It can be used with spermicide to increase effectiveness.  Female condom. This is a soft, loose-fitting sheath that is put into the vagina before sexual intercourse.  Diaphragm. This is a soft, latex, dome-shaped barrier that must be fitted by a health care provider. It is inserted into the vagina, along with a spermicidal jelly. It is inserted before intercourse. The diaphragm should be left in the vagina for 6 to 8 hours after intercourse.  Cervical cap. This is a round, soft, latex or plastic cup that fits over the cervix and must be fitted by a health care provider. The cap can be left in place for up to 48 hours after intercourse.  Sponge. This is a soft, circular piece of polyurethane foam. The sponge has spermicide in it. It is inserted into the vagina after wetting it and before sexual intercourse.  Spermicides. These are chemicals that kill or block sperm from entering the cervix and uterus. They come in the form of creams, jellies, suppositories, foam, or tablets. They do not require a prescription. They are inserted into the vagina with an applicator before having sexual intercourse. The process   must be repeated every time you have sexual intercourse. Intrauterine contraception  Intrauterine device (IUD). This is a T-shaped device that is put in a woman's uterus during a menstrual period to prevent pregnancy. There are 2 types: ? Copper IUD. This type of IUD is wrapped in copper wire and is placed inside the uterus. Copper makes the uterus and fallopian tubes produce a fluid that kills sperm. It can stay in place for 10 years. ? Hormone IUD. This type of IUD contains the hormone progestin (synthetic progesterone). The hormone thickens the cervical mucus and prevents sperm from entering the uterus, and it also thins the uterine lining to prevent  implantation of a fertilized egg. The hormone can weaken or kill the sperm that get into the uterus. It can stay in place for 3-5 years, depending on which type of IUD is used. Permanent methods of contraception  Female tubal ligation. This is when the woman's fallopian tubes are surgically sealed, tied, or blocked to prevent the egg from traveling to the uterus.  Hysteroscopic sterilization. This involves placing a small coil or insert into each fallopian tube. Your doctor uses a technique called hysteroscopy to do the procedure. The device causes scar tissue to form. This results in permanent blockage of the fallopian tubes, so the sperm cannot fertilize the egg. It takes about 3 months after the procedure for the tubes to become blocked. You must use another form of birth control for these 3 months.  Female sterilization. This is when the female has the tubes that carry sperm tied off (vasectomy).This blocks sperm from entering the vagina during sexual intercourse. After the procedure, the man can still ejaculate fluid (semen). Natural planning methods  Natural family planning. This is not having sexual intercourse or using a barrier method (condom, diaphragm, cervical cap) on days the woman could become pregnant.  Calendar method. This is keeping track of the length of each menstrual cycle and identifying when you are fertile.  Ovulation method. This is avoiding sexual intercourse during ovulation.  Symptothermal method. This is avoiding sexual intercourse during ovulation, using a thermometer and ovulation symptoms.  Post-ovulation method. This is timing sexual intercourse after you have ovulated. Regardless of which type or method of contraception you choose, it is important that you use condoms to protect against the transmission of sexually transmitted infections (STIs). Talk with your health care provider about which form of contraception is most appropriate for you. This information is not  intended to replace advice given to you by your health care provider. Make sure you discuss any questions you have with your health care provider. Document Released: 03/03/2005 Document Revised: 08/09/2015 Document Reviewed: 08/26/2012 Elsevier Interactive Patient Education  2017 Elsevier Inc.  

## 2016-08-18 NOTE — Progress Notes (Signed)
   PRENATAL VISIT NOTE  Subjective:  Emma FillerMecaala J Carlson is a 31 y.o. G3P1011 at 4488w4d being seen today for ongoing prenatal care.  She is currently monitored for the following issues for this low-risk pregnancy and has Supervision of normal pregnancy, antepartum; Low vitamin D level; and Back pain affecting pregnancy in second trimester on her problem list.  Patient reports no complaints.  Contractions: Not present. Vag. Bleeding: None.  Movement: Present. Denies leaking of fluid.   The following portions of the patient's history were reviewed and updated as appropriate: allergies, current medications, past family history, past medical history, past social history, past surgical history and problem list. Problem list updated.  Objective:   Vitals:   08/18/16 0909  BP: 102/67  Pulse: 85  Weight: 186 lb 11.2 oz (84.7 kg)    Fetal Status: Fetal Heart Rate (bpm): 142 Fundal Height: 28 cm Movement: Present     General:  Alert, oriented and cooperative. Patient is in no acute distress.  Skin: Skin is warm and dry. No rash noted.   Cardiovascular: Normal heart rate noted  Respiratory: Normal respiratory effort, no problems with respiration noted  Abdomen: Soft, gravid, appropriate for gestational age. Pain/Pressure: Absent     Pelvic:  Cervical exam deferred        Extremities: Normal range of motion.  Edema: Trace  Mental Status: Normal mood and affect. Normal behavior. Normal judgment and thought content.   Assessment and Plan:  Pregnancy: G3P1011 at 7388w4d  1. Supervision of other normal pregnancy, antepartum     Doing well - Glucose Tolerance, 2 Hours w/1 Hour - HIV antibody - RPR - CBC - VITAMIN D 25 Hydroxy (Vit-D Deficiency, Fractures)  2. Low vitamin D level     Taking weekly vitamin D - VITAMIN D 25 Hydroxy (Vit-D Deficiency, Fractures)  Preterm labor symptoms and general obstetric precautions including but not limited to vaginal bleeding, contractions, leaking of fluid and  fetal movement were reviewed in detail with the patient. Please refer to After Visit Summary for other counseling recommendations.  Return in about 2 weeks (around 09/01/2016) for ROB.   Roe Coombsachelle A Tenasia Aull, CNM

## 2016-08-19 ENCOUNTER — Other Ambulatory Visit: Payer: Self-pay | Admitting: Certified Nurse Midwife

## 2016-08-19 DIAGNOSIS — Z3480 Encounter for supervision of other normal pregnancy, unspecified trimester: Secondary | ICD-10-CM

## 2016-08-19 DIAGNOSIS — O99013 Anemia complicating pregnancy, third trimester: Secondary | ICD-10-CM

## 2016-08-19 LAB — HIV ANTIBODY (ROUTINE TESTING W REFLEX): HIV Screen 4th Generation wRfx: NONREACTIVE

## 2016-08-19 LAB — CBC
HEMOGLOBIN: 10 g/dL — AB (ref 11.1–15.9)
Hematocrit: 30.1 % — ABNORMAL LOW (ref 34.0–46.6)
MCH: 29.4 pg (ref 26.6–33.0)
MCHC: 33.2 g/dL (ref 31.5–35.7)
MCV: 89 fL (ref 79–97)
Platelets: 311 10*3/uL (ref 150–379)
RBC: 3.4 x10E6/uL — ABNORMAL LOW (ref 3.77–5.28)
RDW: 14 % (ref 12.3–15.4)
WBC: 14.7 10*3/uL — ABNORMAL HIGH (ref 3.4–10.8)

## 2016-08-19 LAB — RPR: RPR Ser Ql: NONREACTIVE

## 2016-08-19 LAB — VITAMIN D 25 HYDROXY (VIT D DEFICIENCY, FRACTURES): Vit D, 25-Hydroxy: 32 ng/mL (ref 30.0–100.0)

## 2016-08-19 LAB — GLUCOSE TOLERANCE, 2 HOURS W/ 1HR
GLUCOSE, FASTING: 71 mg/dL (ref 65–91)
Glucose, 1 hour: 150 mg/dL (ref 65–179)
Glucose, 2 hour: 137 mg/dL (ref 65–152)

## 2016-08-19 MED ORDER — CITRANATAL BLOOM 90-1 MG PO TABS: 1.0000 | ORAL_TABLET | Freq: Every day | ORAL | 12 refills | Status: DC

## 2016-08-21 ENCOUNTER — Telehealth: Payer: Self-pay

## 2016-08-21 NOTE — Telephone Encounter (Signed)
Patient notified of lab results

## 2016-09-01 ENCOUNTER — Ambulatory Visit (INDEPENDENT_AMBULATORY_CARE_PROVIDER_SITE_OTHER): Payer: BLUE CROSS/BLUE SHIELD | Admitting: Certified Nurse Midwife

## 2016-09-01 VITALS — BP 111/74 | HR 102 | Wt 189.2 lb

## 2016-09-01 DIAGNOSIS — Z23 Encounter for immunization: Secondary | ICD-10-CM

## 2016-09-01 DIAGNOSIS — Z3483 Encounter for supervision of other normal pregnancy, third trimester: Secondary | ICD-10-CM

## 2016-09-01 DIAGNOSIS — Z3480 Encounter for supervision of other normal pregnancy, unspecified trimester: Secondary | ICD-10-CM

## 2016-09-01 DIAGNOSIS — O99013 Anemia complicating pregnancy, third trimester: Secondary | ICD-10-CM

## 2016-09-01 NOTE — Progress Notes (Signed)
   PRENATAL VISIT NOTE  Subjective:  Emma Carlson is a 31 y.o. G3P1011 at 3462w4d being seen today for ongoing prenatal care.  She is currently monitored for the following issues for this low-risk pregnancy and has Supervision of normal pregnancy, antepartum; Back pain affecting pregnancy in second trimester; and Anemia affecting pregnancy in third trimester on her problem list.  Patient reports no complaints.  Contractions: Not present. Vag. Bleeding: None.  Movement: Present. Denies leaking of fluid.   The following portions of the patient's history were reviewed and updated as appropriate: allergies, current medications, past family history, past medical history, past social history, past surgical history and problem list. Problem list updated.  Objective:   Vitals:   09/01/16 0833  BP: 111/74  Pulse: (!) 102  Weight: 189 lb 3.2 oz (85.8 kg)    Fetal Status: Fetal Heart Rate (bpm): 140 Fundal Height: 29 cm Movement: Present     General:  Alert, oriented and cooperative. Patient is in no acute distress.  Skin: Skin is warm and dry. No rash noted.   Cardiovascular: Normal heart rate noted  Respiratory: Normal respiratory effort, no problems with respiration noted  Abdomen: Soft, gravid, appropriate for gestational age. Pain/Pressure: Absent     Pelvic:  Cervical exam deferred        Extremities: Normal range of motion.  Edema: None  Mental Status: Normal mood and affect. Normal behavior. Normal judgment and thought content.   Assessment and Plan:  Pregnancy: G3P1011 at 3462w4d  1. Supervision of other normal pregnancy, antepartum     Doing well - Tdap vaccine greater than or equal to 7yo IM  2. Anemia affecting pregnancy in third trimester     Taking Bloom.  Preterm labor symptoms and general obstetric precautions including but not limited to vaginal bleeding, contractions, leaking of fluid and fetal movement were reviewed in detail with the patient. Please refer to After  Visit Summary for other counseling recommendations.  Return in about 2 weeks (around 09/15/2016) for ROB.   Roe Coombsachelle A Namine Beahm, CNM

## 2016-09-01 NOTE — Progress Notes (Signed)
Patient reports no concerns today-did discuss choosing pediatrician.

## 2016-09-16 ENCOUNTER — Encounter: Payer: Self-pay | Admitting: Certified Nurse Midwife

## 2016-09-16 ENCOUNTER — Ambulatory Visit (INDEPENDENT_AMBULATORY_CARE_PROVIDER_SITE_OTHER): Payer: BLUE CROSS/BLUE SHIELD | Admitting: Certified Nurse Midwife

## 2016-09-16 VITALS — BP 115/76 | HR 112 | Wt 189.6 lb

## 2016-09-16 DIAGNOSIS — D649 Anemia, unspecified: Secondary | ICD-10-CM

## 2016-09-16 DIAGNOSIS — O99013 Anemia complicating pregnancy, third trimester: Secondary | ICD-10-CM

## 2016-09-16 DIAGNOSIS — M549 Dorsalgia, unspecified: Secondary | ICD-10-CM

## 2016-09-16 DIAGNOSIS — O26892 Other specified pregnancy related conditions, second trimester: Secondary | ICD-10-CM

## 2016-09-16 DIAGNOSIS — Z3483 Encounter for supervision of other normal pregnancy, third trimester: Secondary | ICD-10-CM

## 2016-09-16 DIAGNOSIS — Z3480 Encounter for supervision of other normal pregnancy, unspecified trimester: Secondary | ICD-10-CM

## 2016-09-16 DIAGNOSIS — O99891 Other specified diseases and conditions complicating pregnancy: Secondary | ICD-10-CM

## 2016-09-16 DIAGNOSIS — O9989 Other specified diseases and conditions complicating pregnancy, childbirth and the puerperium: Secondary | ICD-10-CM

## 2016-09-16 NOTE — Progress Notes (Signed)
Patient reports she is doing well 

## 2016-09-16 NOTE — Progress Notes (Signed)
   PRENATAL VISIT NOTE  Subjective:  Emma Carlson is a 31 y.o. G3P1011 at 3429w5d being seen today for ongoing prenatal care.  She is currently monitored for the following issues for this low-risk pregnancy and has Supervision of normal pregnancy, antepartum; Back pain affecting pregnancy in second trimester; and Anemia affecting pregnancy in third trimester on her problem list.  Patient reports no complaints.  Contractions: Not present. Vag. Bleeding: None.  Movement: Present. Denies leaking of fluid.   The following portions of the patient's history were reviewed and updated as appropriate: allergies, current medications, past family history, past medical history, past social history, past surgical history and problem list. Problem list updated.  Objective:   Vitals:   09/16/16 0832  BP: 115/76  Pulse: (!) 112  Weight: 189 lb 9.6 oz (86 kg)    Fetal Status: Fetal Heart Rate (bpm): 142 Fundal Height: 30 cm Movement: Present     General:  Alert, oriented and cooperative. Patient is in no acute distress.  Skin: Skin is warm and dry. No rash noted.   Cardiovascular: Normal heart rate noted  Respiratory: Normal respiratory effort, no problems with respiration noted  Abdomen: Soft, gravid, appropriate for gestational age. Pain/Pressure: Absent     Pelvic:  Cervical exam deferred        Extremities: Normal range of motion.  Edema: None  Mental Status: Normal mood and affect. Normal behavior. Normal judgment and thought content.   Assessment and Plan:  Pregnancy: G3P1011 at 5429w5d  1. Supervision of other normal pregnancy, antepartum     Doing well  2. Anemia affecting pregnancy in third trimester     Taking Bloom  3. Back pain affecting pregnancy in second trimester     Has maternity support belt.   Preterm labor symptoms and general obstetric precautions including but not limited to vaginal bleeding, contractions, leaking of fluid and fetal movement were reviewed in detail with  the patient. Please refer to After Visit Summary for other counseling recommendations.  Return in about 2 weeks (around 09/30/2016) for ROB, babyscripts.   Roe Coombsachelle A Calen Posch, CNM

## 2016-09-30 ENCOUNTER — Telehealth: Payer: Self-pay | Admitting: *Deleted

## 2016-09-30 NOTE — Telephone Encounter (Signed)
Called patient because [employer] left a message about patient complaint of swelling in her feet,face,and hands.Patient states that she has had occasional headaches that resolve with rest.Patient took her own blood pressures and they were 103/76 and 117/94.This was reported to Dr Clearance CootsHarper and he wants her to keep her next appointment which is on 10/02/16 at 9:45.Explained to patient if her symptoms get worse to call office to come in earlier.

## 2016-10-02 ENCOUNTER — Ambulatory Visit (INDEPENDENT_AMBULATORY_CARE_PROVIDER_SITE_OTHER): Payer: BLUE CROSS/BLUE SHIELD | Admitting: Certified Nurse Midwife

## 2016-10-02 VITALS — BP 111/73 | HR 98 | Wt 195.2 lb

## 2016-10-02 DIAGNOSIS — Z3483 Encounter for supervision of other normal pregnancy, third trimester: Secondary | ICD-10-CM

## 2016-10-02 DIAGNOSIS — O99013 Anemia complicating pregnancy, third trimester: Secondary | ICD-10-CM

## 2016-10-02 DIAGNOSIS — Z3480 Encounter for supervision of other normal pregnancy, unspecified trimester: Secondary | ICD-10-CM

## 2016-10-02 DIAGNOSIS — D649 Anemia, unspecified: Secondary | ICD-10-CM

## 2016-10-02 NOTE — Progress Notes (Signed)
Patient reports good fetal movement and occasional pain/pressure, denies bleeding.

## 2016-10-02 NOTE — Progress Notes (Signed)
   PRENATAL VISIT NOTE  Subjective:  Emma Carlson is a 31 y.o. G3P1011 at 4753w0d being seen today for ongoing prenatal care.  She is currently monitored for the following issues for this low-risk pregnancy and has Supervision of normal pregnancy, antepartum; Back pain affecting pregnancy in second trimester; and Anemia affecting pregnancy in third trimester on her problem list.  Patient reports backache, no bleeding, no cramping, no leaking and occasional contractions.  Contractions: Irritability. Vag. Bleeding: None.  Movement: Present. Denies leaking of fluid.   The following portions of the patient's history were reviewed and updated as appropriate: allergies, current medications, past family history, past medical history, past social history, past surgical history and problem list. Problem list updated.  Objective:   Vitals:   10/02/16 1004  BP: 111/73  Pulse: 98  Weight: 195 lb 3.2 oz (88.5 kg)    Fetal Status: Fetal Heart Rate (bpm): 143 Fundal Height: 33 cm Movement: Present     General:  Alert, oriented and cooperative. Patient is in no acute distress.  Skin: Skin is warm and dry. No rash noted.   Cardiovascular: Normal heart rate noted  Respiratory: Normal respiratory effort, no problems with respiration noted  Abdomen: Soft, gravid, appropriate for gestational age.  Pain/Pressure: Present     Pelvic: Cervical exam deferred        Extremities: Normal range of motion.  Edema: Trace  Mental Status:  Normal mood and affect. Normal behavior. Normal judgment and thought content.   Assessment and Plan:  Pregnancy: G3P1011 at 753w0d  1. Supervision of other normal pregnancy, antepartum      Doing well.  Letter written for decreased hours at work and increased bathroom breaks.  Is working a lot of hours this pregnancy and is concerned about preterm labor. Does not have PTL symptoms as reviewed.  PTL precautions discussed.   2. Anemia affecting pregnancy in third trimester  Taking Bloom.   Preterm  labor symptoms and general obstetric precautions including but not limited to vaginal bleeding, contractions, leaking of fluid and fetal movement were reviewed in detail with the patient. Please refer to After Visit Summary for other counseling recommendations.  Return in about 3 weeks (around 10/23/2016) for ROB, GBS.   Emma Carlson, CNM

## 2016-10-23 ENCOUNTER — Ambulatory Visit (INDEPENDENT_AMBULATORY_CARE_PROVIDER_SITE_OTHER): Payer: BLUE CROSS/BLUE SHIELD | Admitting: Certified Nurse Midwife

## 2016-10-23 ENCOUNTER — Other Ambulatory Visit (HOSPITAL_COMMUNITY)
Admission: RE | Admit: 2016-10-23 | Discharge: 2016-10-23 | Disposition: A | Discharge status: Home / Self Care | Payer: BLUE CROSS/BLUE SHIELD | Source: Ambulatory Visit | Attending: Certified Nurse Midwife | Admitting: Certified Nurse Midwife

## 2016-10-23 VITALS — BP 115/73 | HR 96 | Wt 194.4 lb

## 2016-10-23 DIAGNOSIS — Z3483 Encounter for supervision of other normal pregnancy, third trimester: Secondary | ICD-10-CM | POA: Diagnosis not present

## 2016-10-23 DIAGNOSIS — Z3480 Encounter for supervision of other normal pregnancy, unspecified trimester: Secondary | ICD-10-CM

## 2016-10-23 DIAGNOSIS — Z3A36 36 weeks gestation of pregnancy: Secondary | ICD-10-CM | POA: Diagnosis not present

## 2016-10-23 LAB — OB RESULTS CONSOLE GC/CHLAMYDIA: Gonorrhea: NEGATIVE

## 2016-10-23 NOTE — Progress Notes (Signed)
   PRENATAL VISIT NOTE  Subjective:  Emma Carlson is a 31 y.o. G3P1011 at 7991w0d being seen today for ongoing prenatal care.  She is currently monitored for the following issues for this low-risk pregnancy and has Supervision of normal pregnancy, antepartum; Back pain affecting pregnancy in second trimester; and Anemia affecting pregnancy in third trimester on her problem list.  Patient reports no bleeding, no leaking and occasional contractions.  Contractions: Irregular. Vag. Bleeding: None.  Movement: Present. Denies leaking of fluid.   The following portions of the patient's history were reviewed and updated as appropriate: allergies, current medications, past family history, past medical history, past social history, past surgical history and problem list. Problem list updated.  Objective:   Vitals:   10/23/16 0829  BP: 115/73  Pulse: 96  Weight: 194 lb 6.4 oz (88.2 kg)    Fetal Status: Fetal Heart Rate (bpm): NST; reactive   Movement: Present  Presentation: Vertex  General:  Alert, oriented and cooperative. Patient is in no acute distress.  Skin: Skin is warm and dry. No rash noted.   Cardiovascular: Normal heart rate noted  Respiratory: Normal respiratory effort, no problems with respiration noted  Abdomen: Soft, gravid, appropriate for gestational age.  Pain/Pressure: Present     Pelvic: Cervical exam performed Dilation: Closed Effacement (%): Thick Station: Ballotable  Extremities: Normal range of motion.  Edema: Trace  Mental Status:  Normal mood and affect. Normal behavior. Normal judgment and thought content.  NST: + accels, no decels, moderate variability, Cat. 1 tracing. No contractions on toco.   Assessment and Plan:  Pregnancy: G3P1011 at 6491w0d  1. Supervision of other normal pregnancy, antepartum     Reactive NST - Strep Gp B NAA - Cervicovaginal ancillary only  Preterm labor symptoms and general obstetric precautions including but not limited to vaginal  bleeding, contractions, leaking of fluid and fetal movement were reviewed in detail with the patient. Please refer to After Visit Summary for other counseling recommendations.  Return in about 1 week (around 10/30/2016) for ROB.   Roe Coombsachelle A Kally Cadden, CNM

## 2016-10-23 NOTE — Progress Notes (Signed)
Patient is having contractions for 1 1/2 days- she is having them today as well.

## 2016-10-25 LAB — STREP GP B NAA: STREP GROUP B AG: POSITIVE — AB

## 2016-10-27 LAB — CERVICOVAGINAL ANCILLARY ONLY
Bacterial vaginitis: POSITIVE — AB
CANDIDA VAGINITIS: NEGATIVE
Chlamydia: NEGATIVE
Neisseria Gonorrhea: NEGATIVE
TRICH (WINDOWPATH): NEGATIVE

## 2016-10-29 ENCOUNTER — Other Ambulatory Visit: Payer: Self-pay | Admitting: Certified Nurse Midwife

## 2016-10-29 DIAGNOSIS — Z3480 Encounter for supervision of other normal pregnancy, unspecified trimester: Secondary | ICD-10-CM

## 2016-10-29 DIAGNOSIS — N760 Acute vaginitis: Principal | ICD-10-CM

## 2016-10-29 DIAGNOSIS — O99820 Streptococcus B carrier state complicating pregnancy: Secondary | ICD-10-CM

## 2016-10-29 DIAGNOSIS — B9689 Other specified bacterial agents as the cause of diseases classified elsewhere: Secondary | ICD-10-CM

## 2016-10-29 MED ORDER — METRONIDAZOLE 500 MG PO TABS: 500.0000 mg | ORAL_TABLET | Freq: Two times a day (BID) | ORAL | 0 refills | Status: DC

## 2016-10-30 ENCOUNTER — Other Ambulatory Visit: Payer: Self-pay | Admitting: *Deleted

## 2016-10-30 DIAGNOSIS — N760 Acute vaginitis: Principal | ICD-10-CM

## 2016-10-30 DIAGNOSIS — B9689 Other specified bacterial agents as the cause of diseases classified elsewhere: Secondary | ICD-10-CM

## 2016-10-30 MED ORDER — METRONIDAZOLE 500 MG PO TABS: 500.0000 mg | ORAL_TABLET | Freq: Two times a day (BID) | ORAL | 0 refills | Status: DC

## 2016-10-31 ENCOUNTER — Encounter: Payer: BLUE CROSS/BLUE SHIELD | Admitting: Certified Nurse Midwife

## 2016-11-02 ENCOUNTER — Inpatient Hospital Stay (HOSPITAL_COMMUNITY)
Admission: AD | Admit: 2016-11-02 | Discharge: 2016-11-02 | Disposition: A | Discharge status: Home / Self Care | Payer: BLUE CROSS/BLUE SHIELD | Source: Ambulatory Visit | Attending: Family Medicine | Admitting: Family Medicine

## 2016-11-02 ENCOUNTER — Encounter (HOSPITAL_COMMUNITY): Payer: Self-pay

## 2016-11-02 DIAGNOSIS — O368130 Decreased fetal movements, third trimester, not applicable or unspecified: Secondary | ICD-10-CM | POA: Diagnosis not present

## 2016-11-02 DIAGNOSIS — Z3A37 37 weeks gestation of pregnancy: Secondary | ICD-10-CM

## 2016-11-02 DIAGNOSIS — Z3A38 38 weeks gestation of pregnancy: Secondary | ICD-10-CM | POA: Diagnosis not present

## 2016-11-02 DIAGNOSIS — O34219 Maternal care for unspecified type scar from previous cesarean delivery: Secondary | ICD-10-CM | POA: Diagnosis not present

## 2016-11-02 DIAGNOSIS — O26893 Other specified pregnancy related conditions, third trimester: Secondary | ICD-10-CM

## 2016-11-02 DIAGNOSIS — O34211 Maternal care for low transverse scar from previous cesarean delivery: Secondary | ICD-10-CM | POA: Insufficient documentation

## 2016-11-02 DIAGNOSIS — M549 Dorsalgia, unspecified: Secondary | ICD-10-CM | POA: Diagnosis not present

## 2016-11-02 DIAGNOSIS — O9989 Other specified diseases and conditions complicating pregnancy, childbirth and the puerperium: Secondary | ICD-10-CM

## 2016-11-02 DIAGNOSIS — O99891 Other specified diseases and conditions complicating pregnancy: Secondary | ICD-10-CM

## 2016-11-02 LAB — URINALYSIS, ROUTINE W REFLEX MICROSCOPIC
Bacteria, UA: NONE SEEN
Bilirubin Urine: NEGATIVE
Glucose, UA: NEGATIVE mg/dL
Hgb urine dipstick: NEGATIVE
Ketones, ur: 5 mg/dL — AB
Leukocytes, UA: NEGATIVE
Nitrite: NEGATIVE
PH: 5 (ref 5.0–8.0)
PROTEIN: 30 mg/dL — AB
Specific Gravity, Urine: 1.028 (ref 1.005–1.030)

## 2016-11-02 NOTE — MAU Note (Signed)
Patient is here for decreased fetal movement and back pain that's is constant. Patient has had her back pain for 2 weeks now. Patient denies any bleeding or LOF.

## 2016-11-02 NOTE — Discharge Instructions (Signed)
Braxton Hicks Contractions °Contractions of the uterus can occur throughout pregnancy, but they are not always a sign that you are in labor. You may have practice contractions called Braxton Hicks contractions. These false labor contractions are sometimes confused with true labor. °What are Braxton Hicks contractions? °Braxton Hicks contractions are tightening movements that occur in the muscles of the uterus before labor. Unlike true labor contractions, these contractions do not result in opening (dilation) and thinning of the cervix. Toward the end of pregnancy (32-34 weeks), Braxton Hicks contractions can happen more often and may become stronger. These contractions are sometimes difficult to tell apart from true labor because they can be very uncomfortable. You should not feel embarrassed if you go to the hospital with false labor. °Sometimes, the only way to tell if you are in true labor is for your health care provider to look for changes in the cervix. The health care provider will do a physical exam and may monitor your contractions. If you are not in true labor, the exam should show that your cervix is not dilating and your water has not broken. °If there are no prenatal problems or other health problems associated with your pregnancy, it is completely safe for you to be sent home with false labor. You may continue to have Braxton Hicks contractions until you go into true labor. °How can I tell the difference between true labor and false labor? °· Differences °? False labor °? Contractions last 30-70 seconds.: Contractions are usually shorter and not as strong as true labor contractions. °? Contractions become very regular.: Contractions are usually irregular. °? Discomfort is usually felt in the top of the uterus, and it spreads to the lower abdomen and low back.: Contractions are often felt in the front of the lower abdomen and in the groin. °? Contractions do not go away with walking.: Contractions may  go away when you walk around or change positions while lying down. °? Contractions usually become more intense and increase in frequency.: Contractions get weaker and are shorter-lasting as time goes on. °? The cervix dilates and gets thinner.: The cervix usually does not dilate or become thin. °Follow these instructions at home: °· Take over-the-counter and prescription medicines only as told by your health care provider. °· Keep up with your usual exercises and follow other instructions from your health care provider. °· Eat and drink lightly if you think you are going into labor. °· If Braxton Hicks contractions are making you uncomfortable: °? Change your position from lying down or resting to walking, or change from walking to resting. °? Sit and rest in a tub of warm water. °? Drink enough fluid to keep your urine clear or pale yellow. Dehydration may cause these contractions. °? Do slow and deep breathing several times an hour. °· Keep all follow-up prenatal visits as told by your health care provider. This is important. °Contact a health care provider if: °· You have a fever. °· You have continuous pain in your abdomen. °Get help right away if: °· Your contractions become stronger, more regular, and closer together. °· You have fluid leaking or gushing from your vagina. °· You pass blood-tinged mucus (bloody show). °· You have bleeding from your vagina. °· You have low back pain that you never had before. °· You feel your baby’s head pushing down and causing pelvic pressure. °· Your baby is not moving inside you as much as it used to. °Summary °· Contractions that occur before labor are   called Braxton Hicks contractions, false labor, or practice contractions.  Braxton Hicks contractions are usually shorter, weaker, farther apart, and less regular than true labor contractions. True labor contractions usually become progressively stronger and regular and they become more frequent.  Manage discomfort from  [facility] contractions by changing position, resting in a warm bath, drinking plenty of water, or practicing deep breathing. This information is not intended to replace advice given to you by your health care provider. Make sure you discuss any questions you have with your health care provider. Document Released: 03/03/2005 Document Revised: 01/21/2016 Document Reviewed: 01/21/2016 Elsevier Interactive Patient Education  2017 [employer]  Vaginal Birth After Cesarean Delivery Vaginal birth after cesarean delivery (VBAC) is giving birth vaginally after previously delivering a baby by a cesarean. In the past, if a woman had a cesarean delivery, all births afterward would be done by cesarean delivery. This is no longer true. It can be safe for the mother to try a vaginal delivery after having a cesarean delivery. It is important to discuss VBAC with your health care provider early in the pregnancy so you can understand the risks, benefits, and options. It will give you time to decide what is best in your particular case. The final decision about whether to have a VBAC or repeat cesarean delivery should be between you and your health care provider. Any changes in your health or your babys health during your pregnancy may make it necessary to change your initial decision about VBAC. Women who plan to have a VBAC should check with their health care provider to be sure that:  The previous cesarean delivery was done with a low transverse uterine cut (incision) (not a vertical classical incision).  The birth canal is big enough for the baby.  There were no other operations on the uterus.  An electronic fetal monitor (EFM) will be on at all times during labor.  An operating room will be available and ready in case an emergency cesarean delivery is needed.  A health care provider and surgical nursing staff will be available at all times during labor to be ready to do an emergency delivery cesarean  if necessary.  An anesthesiologist will be present in case an emergency cesarean delivery is needed.  The nursery is prepared and has adequate personnel and necessary equipment available to care for the baby in case of an emergency cesarean delivery. Benefits of VBAC  Shorter stay in the hospital.  Avoidance of risks associated with cesarean delivery, such as: ? Surgical complications, such as opening of the incision or hernia in the incision. ? Injury to other organs. ? Fever. This can occur if an infection develops after surgery. It can also occur as a reaction to the medicine given to make you numb during the surgery.  Less blood loss and need for blood transfusions.  Lower risk of blood clots and infection.  Shorter recovery.  Decreased risk for having to remove the uterus (hysterectomy).  Decreased risk for the placenta to completely or partially cover the opening of the uterus (placenta previa) with a future pregnancy.  Decrease risk in future labor and delivery. Risks of a VBAC  Tearing (rupture) of the uterus. This is occurs in less than 1% of VBACs. The risk of this happening is higher if: ? Steps are taken to begin the labor process (induce labor) or stimulate or strengthen contractions (augment labor). ? Medicine is used to soften (ripen) the cervix.  Having to remove the uterus (  hysterectomy) if it ruptures. VBAC should not be done if:  The previous cesarean delivery was done with a vertical (classical) or T-shaped incision or you do not know what kind of incision was made.  You had a ruptured uterus.  You have had certain types of surgery on your uterus, such as removal of uterine fibroids. Ask your health care provider about other types of surgeries that prevent you from having a VBAC.  You have certain medical or childbirth (obstetrical) problems.  There are problems with the baby.  You have had two previous cesarean deliveries and no vaginal  deliveries. Other facts to know about VBAC:  It is safe to have an epidural anesthetic with VBAC.  It is safe to turn the baby from a breech position (attempt an external cephalic version).  It is safe to try a VBAC with twins.  VBAC may not be successful if your baby weights 8.8 lb (4 kg) or more. However, weight predictions are not always accurate and should not be used alone to decide if VBAC is right for you.  There is an increased failure rate if the time between the cesarean delivery and VBAC is less than 19 months.  Your health care provider may advise against a VBAC if you have preeclampsia (high blood pressure, protein in the urine, and swelling of face and extremities).  VBAC is often successful if you previously gave birth vaginally.  VBAC is often successful when the labor starts spontaneously before the due date.  Delivering a baby through a VBAC is similar to having a normal spontaneous vaginal delivery. This information is not intended to replace advice given to you by your health care provider. Make sure you discuss any questions you have with your health care provider. Document Released: 08/24/2006 Document Revised: 08/09/2015 Document Reviewed: 09/30/2012 Elsevier Interactive Patient Education  2018 [employer].   Fetal Movement Counts Patient Name: ________________________________________________ Patient Due Date: ____________________ What is a fetal movement count? A fetal movement count is the number of times that you feel your baby move during a certain amount of time. This may also be called a fetal kick count. A fetal movement count is recommended for every pregnant woman. You may be asked to start counting fetal movements as early as week 28 of your pregnancy. Pay attention to when your baby is most active. You may notice your baby's sleep and wake cycles. You may also notice things that make your baby move more. You should do a fetal movement count:  When  your baby is normally most active.  At the same time each day.  A good time to count movements is while you are resting, after having something to eat and drink. How do I count fetal movements? 1. Find a quiet, comfortable area. Sit, or lie down on your side. 2. Write down the date, the start time and stop time, and the number of movements that you felt between those two times. Take this information with you to your health care visits. 3. For 2 hours, count kicks, flutters, swishes, rolls, and jabs. You should feel at least 10 movements during 2 hours. 4. You may stop counting after you have felt 10 movements. 5. If you do not feel 10 movements in 2 hours, have something to eat and drink. Then, keep resting and counting for 1 hour. If you feel at least 4 movements during that hour, you may stop counting. Contact a health care provider if:  You feel fewer than  4 movements in 2 hours.  Your baby is not moving like he or she usually does. Date: ____________ Start time: ____________ Stop time: ____________ Movements: ____________ Date: ____________ Start time: ____________ Stop time: ____________ Movements: ____________ Date: ____________ Start time: ____________ Stop time: ____________ Movements: ____________ Date: ____________ Start time: ____________ Stop time: ____________ Movements: ____________ Date: ____________ Start time: ____________ Stop time: ____________ Movements: ____________ Date: ____________ Start time: ____________ Stop time: ____________ Movements: ____________ Date: ____________ Start time: ____________ Stop time: ____________ Movements: ____________ Date: ____________ Start time: ____________ Stop time: ____________ Movements: ____________ Date: ____________ Start time: ____________ Stop time: ____________ Movements: ____________ This information is not intended to replace advice given to you by your health care provider. Make sure you discuss any questions you have with  your health care provider. Document Released: 04/02/2006 Document Revised: 10/31/2015 Document Reviewed: 04/12/2015 Elsevier Interactive Patient Education  [employer].

## 2016-11-02 NOTE — MAU Provider Note (Signed)
Chief Complaint  Patient presents with  . Decreased Fetal Movement     First Provider Initiated Contact with Patient 11/02/16 2056      S: Emma Carlson  is a 31 y.o. y.o. year old G52P1011 female at [redacted]w[redacted]d weeks gestation who presents to MAU reporting decreased fetal movement..   Contractions: Mild Vaginal bleeding: denies Leaking of fluid: denies  Patient Active Problem List   Diagnosis Date Noted  . History of cesarean delivery, antepartum 11/02/2016  . GBS (group B Streptococcus carrier), +RV culture, currently pregnant 10/29/2016  . Anemia affecting pregnancy in third trimester 08/19/2016  . Back pain affecting pregnancy in second trimester 06/26/2016  . Supervision of normal pregnancy, antepartum 05/07/2016    O: Patient Vitals for the past 24 hrs:  BP Temp Temp src Pulse Resp  11/02/16 2044 (!) 113/58 98.4 F (36.9 C) Oral 98 18   General: NAD Heart: Regular rate Lungs: Normal rate and effort Abd: Soft, NT, Gravid, S=D Pelvic: NEFG, no blood.  Dilation: Closed Exam by:: Dorathy Kinsman CNM   NST performed EFM: 145, reactive Toco: UI  A: [redacted]w[redacted]d week IUP Decreased fetal movement resolved with reactive NST.  P: Discharge home in stable condition. Labor precautions and fetal kick counts. Follow-up as scheduled for prenatal visit or sooner as needed if symptoms worsen. Return to maternity admissions as needed if symptoms worsen.  Katrinka Blazing, [place], [place] 11/02/2016 9:27 PM  2

## 2016-11-04 ENCOUNTER — Encounter: Payer: Self-pay | Admitting: Certified Nurse Midwife

## 2016-11-04 ENCOUNTER — Ambulatory Visit (INDEPENDENT_AMBULATORY_CARE_PROVIDER_SITE_OTHER): Payer: BLUE CROSS/BLUE SHIELD | Admitting: Certified Nurse Midwife

## 2016-11-04 VITALS — BP 112/72 | HR 92 | Wt 194.4 lb

## 2016-11-04 DIAGNOSIS — O99820 Streptococcus B carrier state complicating pregnancy: Secondary | ICD-10-CM

## 2016-11-04 DIAGNOSIS — O99013 Anemia complicating pregnancy, third trimester: Secondary | ICD-10-CM

## 2016-11-04 DIAGNOSIS — O34219 Maternal care for unspecified type scar from previous cesarean delivery: Secondary | ICD-10-CM

## 2016-11-04 DIAGNOSIS — Z3480 Encounter for supervision of other normal pregnancy, unspecified trimester: Secondary | ICD-10-CM

## 2016-11-04 NOTE — Progress Notes (Signed)
   PRENATAL VISIT NOTE  Subjective:  Emma Carlson is a 31 y.o. G3P1011 at [redacted]w[redacted]d being seen today for ongoing prenatal care.  She is currently monitored for the following issues for this low-risk pregnancy and has Supervision of normal pregnancy, antepartum; Back pain affecting pregnancy in second trimester; Anemia affecting pregnancy in third trimester; GBS (group B Streptococcus carrier), +RV culture, currently pregnant; and History of cesarean delivery, antepartum on her problem list.  Patient reports no complaints.  Contractions: Irregular. Vag. Bleeding: None.  Movement: Present. Denies leaking of fluid.   The following portions of the patient's history were reviewed and updated as appropriate: allergies, current medications, past family history, past medical history, past social history, past surgical history and problem list. Problem list updated.  Objective:   Vitals:   11/04/16 0813  BP: 112/72  Pulse: 92  Weight: 194 lb 6.4 oz (88.2 kg)    Fetal Status: Fetal Heart Rate (bpm): 145; doppler Fundal Height: 39 cm Movement: Present     General:  Alert, oriented and cooperative. Patient is in no acute distress.  Skin: Skin is warm and dry. No rash noted.   Cardiovascular: Normal heart rate noted  Respiratory: Normal respiratory effort, no problems with respiration noted  Abdomen: Soft, gravid, appropriate for gestational age.  Pain/Pressure: Absent     Pelvic: Cervical exam deferred        Extremities: Normal range of motion.  Edema: None  Mental Status:  Normal mood and affect. Normal behavior. Normal judgment and thought content.   Assessment and Plan:  Pregnancy: G3P1011 at [redacted]w[redacted]d  1. Supervision of other normal pregnancy, antepartum     Doing well  2. Anemia affecting pregnancy in third trimester     Taking Bloom  3. GBS (group B Streptococcus carrier), +RV culture, currently pregnant      PCN for labor/delivery  4. History of cesarean delivery, antepartum  TOLAC  Term labor symptoms and general obstetric precautions including but not limited to vaginal bleeding, contractions, leaking of fluid and fetal movement were reviewed in detail with the patient. Please refer to After Visit Summary for other counseling recommendations.  Return in about 1 week (around 11/11/2016) for ROB.   Roe Coombs, CNM

## 2016-11-12 ENCOUNTER — Ambulatory Visit (INDEPENDENT_AMBULATORY_CARE_PROVIDER_SITE_OTHER): Payer: BLUE CROSS/BLUE SHIELD | Admitting: Certified Nurse Midwife

## 2016-11-12 ENCOUNTER — Encounter: Payer: Self-pay | Admitting: Certified Nurse Midwife

## 2016-11-12 VITALS — BP 109/69 | HR 86 | Wt 197.9 lb

## 2016-11-12 DIAGNOSIS — O99013 Anemia complicating pregnancy, third trimester: Secondary | ICD-10-CM

## 2016-11-12 DIAGNOSIS — Z3480 Encounter for supervision of other normal pregnancy, unspecified trimester: Secondary | ICD-10-CM

## 2016-11-12 DIAGNOSIS — O34219 Maternal care for unspecified type scar from previous cesarean delivery: Secondary | ICD-10-CM

## 2016-11-12 DIAGNOSIS — O99820 Streptococcus B carrier state complicating pregnancy: Secondary | ICD-10-CM

## 2016-11-12 NOTE — Progress Notes (Signed)
Patient is having contractions 30 minutes apart.

## 2016-11-12 NOTE — Progress Notes (Signed)
   PRENATAL VISIT NOTE  Subjective:  Emma Carlson is a 31 y.o. G3P1011 at [redacted]w[redacted]d being seen today for ongoing prenatal care.  She is currently monitored for the following issues for this low-risk pregnancy and has Supervision of normal pregnancy, antepartum; Back pain affecting pregnancy in second trimester; Anemia affecting pregnancy in third trimester; GBS (group B Streptococcus carrier), +RV culture, currently pregnant; and History of cesarean delivery, antepartum on her problem list.  Patient reports no bleeding, no leaking and occasional contractions.  Contractions: Irregular. Vag. Bleeding: None.  Movement: Present. Denies leaking of fluid.   The following portions of the patient's history were reviewed and updated as appropriate: allergies, current medications, past family history, past medical history, past social history, past surgical history and problem list. Problem list updated.  Objective:   Vitals:   11/12/16 0830  BP: 109/69  Pulse: 86  Weight: 197 lb 14.4 oz (89.8 kg)    Fetal Status: Fetal Heart Rate (bpm): 137; doppler Fundal Height: 40 cm Movement: Present  Presentation: Vertex  General:  Alert, oriented and cooperative. Patient is in no acute distress.  Skin: Skin is warm and dry. No rash noted.   Cardiovascular: Normal heart rate noted  Respiratory: Normal respiratory effort, no problems with respiration noted  Abdomen: Soft, gravid, appropriate for gestational age.  Pain/Pressure: Present     Pelvic: Cervical exam performed Dilation: 1 Effacement (%): Thick Station: -3, soft, anterior  Extremities: Normal range of motion.  Edema: None  Mental Status:  Normal mood and affect. Normal behavior. Normal judgment and thought content.   Assessment and Plan:  Pregnancy: G3P1011 at [redacted]w[redacted]d  1. Supervision of other normal pregnancy, antepartum     Doing well  2. Anemia affecting pregnancy in third trimester     Taking bloom  3. GBS (group B Streptococcus carrier),  +RV culture, currently pregnant     PCN for labor/delivery  4. History of cesarean delivery, antepartum     TOLAC  Term labor symptoms and general obstetric precautions including but not limited to vaginal bleeding, contractions, leaking of fluid and fetal movement were reviewed in detail with the patient. Please refer to After Visit Summary for other counseling recommendations.  Return in about 1 week (around 11/19/2016) for ROB.   Roe Coombs, CNM

## 2016-11-14 ENCOUNTER — Encounter (HOSPITAL_COMMUNITY): Payer: Self-pay | Admitting: *Deleted

## 2016-11-14 ENCOUNTER — Inpatient Hospital Stay (HOSPITAL_COMMUNITY)
Admission: AD | Admit: 2016-11-14 | Discharge: 2016-11-14 | Disposition: A | Discharge status: Home / Self Care | Payer: BLUE CROSS/BLUE SHIELD | Source: Ambulatory Visit | Attending: Obstetrics and Gynecology | Admitting: Obstetrics and Gynecology

## 2016-11-14 DIAGNOSIS — Z3A39 39 weeks gestation of pregnancy: Secondary | ICD-10-CM | POA: Diagnosis not present

## 2016-11-14 DIAGNOSIS — O479 False labor, unspecified: Secondary | ICD-10-CM

## 2016-11-14 NOTE — MAU Note (Signed)
Pt presents to MAU c/o ctx that started around 1600. The ctxs are now apart. Pt denies bleeding or LOF. Pt reports good fetal movement.

## 2016-11-19 ENCOUNTER — Inpatient Hospital Stay (HOSPITAL_COMMUNITY): Payer: BLUE CROSS/BLUE SHIELD | Admitting: Anesthesiology

## 2016-11-19 ENCOUNTER — Inpatient Hospital Stay (HOSPITAL_COMMUNITY)
Admission: AD | Admit: 2016-11-19 | Discharge: 2016-11-20 | DRG: 775 | Disposition: A | Discharge status: Home / Self Care | Payer: BLUE CROSS/BLUE SHIELD | Source: Ambulatory Visit | Attending: Family Medicine | Admitting: Family Medicine

## 2016-11-19 ENCOUNTER — Encounter (HOSPITAL_COMMUNITY): Payer: Self-pay

## 2016-11-19 DIAGNOSIS — O99824 Streptococcus B carrier state complicating childbirth: Secondary | ICD-10-CM | POA: Diagnosis not present

## 2016-11-19 DIAGNOSIS — O34211 Maternal care for low transverse scar from previous cesarean delivery: Secondary | ICD-10-CM | POA: Diagnosis not present

## 2016-11-19 DIAGNOSIS — O34219 Maternal care for unspecified type scar from previous cesarean delivery: Secondary | ICD-10-CM | POA: Diagnosis not present

## 2016-11-19 DIAGNOSIS — Z87891 Personal history of nicotine dependence: Secondary | ICD-10-CM

## 2016-11-19 DIAGNOSIS — Z3A39 39 weeks gestation of pregnancy: Secondary | ICD-10-CM

## 2016-11-19 DIAGNOSIS — Z3480 Encounter for supervision of other normal pregnancy, unspecified trimester: Secondary | ICD-10-CM

## 2016-11-19 DIAGNOSIS — O4292 Full-term premature rupture of membranes, unspecified as to length of time between rupture and onset of labor: Secondary | ICD-10-CM | POA: Diagnosis not present

## 2016-11-19 DIAGNOSIS — Z98891 History of uterine scar from previous surgery: Secondary | ICD-10-CM

## 2016-11-19 LAB — CBC
HCT: 34.3 % — ABNORMAL LOW (ref 36.0–46.0)
Hemoglobin: 12 g/dL (ref 12.0–15.0)
MCH: 30.1 pg (ref 26.0–34.0)
MCHC: 35 g/dL (ref 30.0–36.0)
MCV: 86 fL (ref 78.0–100.0)
Platelets: 284 10*3/uL (ref 150–400)
RBC: 3.99 MIL/uL (ref 3.87–5.11)
RDW: 14.9 % (ref 11.5–15.5)
WBC: 21.2 10*3/uL — AB (ref 4.0–10.5)

## 2016-11-19 LAB — POCT FERN TEST: POCT Fern Test: POSITIVE

## 2016-11-19 LAB — HIV ANTIBODY (ROUTINE TESTING W REFLEX): HIV Screen 4th Generation wRfx: NONREACTIVE

## 2016-11-19 LAB — TYPE AND SCREEN
ABO/RH(D): A POS
Antibody Screen: NEGATIVE

## 2016-11-19 LAB — RPR: RPR: NONREACTIVE

## 2016-11-19 MED ORDER — SOD CITRATE-CITRIC ACID 500-334 MG/5ML PO SOLN: 30.0000 mL | ORAL | Status: DC | PRN

## 2016-11-19 MED ORDER — LACTATED RINGERS IV SOLN
INTRAVENOUS | Status: DC
  Administered 2016-11-19 (×3): via INTRAVENOUS

## 2016-11-19 MED ORDER — ACETAMINOPHEN 325 MG PO TABS: 650.0000 mg | ORAL_TABLET | ORAL | Status: DC | PRN

## 2016-11-19 MED ORDER — PHENYLEPHRINE 40 MCG/ML (10ML) SYRINGE FOR IV PUSH (FOR BLOOD PRESSURE SUPPORT)
PREFILLED_SYRINGE | INTRAVENOUS | Status: AC
  Filled 2016-11-19: qty 10

## 2016-11-19 MED ORDER — ONDANSETRON HCL 4 MG/2ML IJ SOLN: 4.0000 mg | INTRAMUSCULAR | Status: DC | PRN

## 2016-11-19 MED ORDER — PHENYLEPHRINE 40 MCG/ML (10ML) SYRINGE FOR IV PUSH (FOR BLOOD PRESSURE SUPPORT)
80.0000 ug | PREFILLED_SYRINGE | INTRAVENOUS | Status: DC | PRN
  Filled 2016-11-19: qty 5

## 2016-11-19 MED ORDER — LACTATED RINGERS IV SOLN
500.0000 mL | Freq: Once | INTRAVENOUS | Status: AC
  Administered 2016-11-19: 500 mL via INTRAVENOUS

## 2016-11-19 MED ORDER — LIDOCAINE HCL (PF) 1 % IJ SOLN
30.0000 mL | INTRAMUSCULAR | Status: DC | PRN
  Filled 2016-11-19: qty 30

## 2016-11-19 MED ORDER — ZOLPIDEM TARTRATE 5 MG PO TABS: 5.0000 mg | ORAL_TABLET | Freq: Every evening | ORAL | Status: DC | PRN

## 2016-11-19 MED ORDER — OXYCODONE-ACETAMINOPHEN 5-325 MG PO TABS: 2.0000 | ORAL_TABLET | ORAL | Status: DC | PRN

## 2016-11-19 MED ORDER — OXYCODONE-ACETAMINOPHEN 5-325 MG PO TABS: 1.0000 | ORAL_TABLET | ORAL | Status: DC | PRN

## 2016-11-19 MED ORDER — OXYTOCIN 40 UNITS IN LACTATED RINGERS INFUSION - SIMPLE MED
2.5000 [IU]/h | INTRAVENOUS | Status: DC
  Administered 2016-11-19: 2.5 [IU]/h via INTRAVENOUS
  Filled 2016-11-19: qty 1000

## 2016-11-19 MED ORDER — DIPHENHYDRAMINE HCL 25 MG PO CAPS: 25.0000 mg | ORAL_CAPSULE | Freq: Four times a day (QID) | ORAL | Status: DC | PRN

## 2016-11-19 MED ORDER — DIBUCAINE 1 % RE OINT: 1.0000 "application " | TOPICAL_OINTMENT | RECTAL | Status: DC | PRN

## 2016-11-19 MED ORDER — FENTANYL 2.5 MCG/ML BUPIVACAINE 1/10 % EPIDURAL INFUSION (WH - ANES)
14.0000 mL/h | INTRAMUSCULAR | Status: DC | PRN
  Administered 2016-11-19 (×3): 14 mL/h via EPIDURAL
  Filled 2016-11-19 (×2): qty 100

## 2016-11-19 MED ORDER — SODIUM CHLORIDE 0.9 % IV SOLN
2.0000 g | Freq: Once | INTRAVENOUS | Status: AC
  Administered 2016-11-19: 2 g via INTRAVENOUS
  Filled 2016-11-19: qty 2000

## 2016-11-19 MED ORDER — PRENATAL MULTIVITAMIN CH
1.0000 | ORAL_TABLET | Freq: Every day | ORAL | Status: DC
  Administered 2016-11-20: 1 via ORAL
  Filled 2016-11-19: qty 1

## 2016-11-19 MED ORDER — IBUPROFEN 600 MG PO TABS
600.0000 mg | ORAL_TABLET | Freq: Four times a day (QID) | ORAL | Status: DC
  Administered 2016-11-19 – 2016-11-20 (×4): 600 mg via ORAL
  Filled 2016-11-19 (×4): qty 1

## 2016-11-19 MED ORDER — SODIUM CHLORIDE 0.9 % IV SOLN
2.0000 g | Freq: Four times a day (QID) | INTRAVENOUS | Status: DC
  Administered 2016-11-19 (×2): 2 g via INTRAVENOUS
  Filled 2016-11-19 (×4): qty 2000

## 2016-11-19 MED ORDER — ONDANSETRON HCL 4 MG PO TABS: 4.0000 mg | ORAL_TABLET | ORAL | Status: DC | PRN

## 2016-11-19 MED ORDER — COCONUT OIL OIL
1.0000 | TOPICAL_OIL | Status: DC | PRN
Start: 2016-11-19 — End: 2016-11-21

## 2016-11-19 MED ORDER — DIPHENHYDRAMINE HCL 50 MG/ML IJ SOLN: 12.5000 mg | INTRAMUSCULAR | Status: DC | PRN

## 2016-11-19 MED ORDER — OXYTOCIN BOLUS FROM INFUSION
500.0000 mL | Freq: Once | INTRAVENOUS | Status: AC
  Administered 2016-11-19: 500 mL via INTRAVENOUS

## 2016-11-19 MED ORDER — WITCH HAZEL-GLYCERIN EX PADS: 1.0000 "application " | MEDICATED_PAD | CUTANEOUS | Status: DC | PRN

## 2016-11-19 MED ORDER — TETANUS-DIPHTH-ACELL PERTUSSIS 5-2.5-18.5 LF-MCG/0.5 IM SUSP: 0.5000 mL | Freq: Once | INTRAMUSCULAR | Status: DC

## 2016-11-19 MED ORDER — BENZOCAINE-MENTHOL 20-0.5 % EX AERO: 1.0000 "application " | INHALATION_SPRAY | CUTANEOUS | Status: DC | PRN

## 2016-11-19 MED ORDER — TERBUTALINE SULFATE 1 MG/ML IJ SOLN
0.2500 mg | Freq: Once | INTRAMUSCULAR | Status: DC | PRN
  Filled 2016-11-19: qty 1

## 2016-11-19 MED ORDER — EPHEDRINE 5 MG/ML INJ
10.0000 mg | INTRAVENOUS | Status: DC | PRN
  Filled 2016-11-19: qty 2

## 2016-11-19 MED ORDER — ACETAMINOPHEN 325 MG PO TABS
650.0000 mg | ORAL_TABLET | ORAL | Status: DC | PRN
Start: 2016-11-19 — End: 2016-11-19

## 2016-11-19 MED ORDER — FENTANYL 2.5 MCG/ML BUPIVACAINE 1/10 % EPIDURAL INFUSION (WH - ANES)
INTRAMUSCULAR | Status: AC
  Filled 2016-11-19: qty 100

## 2016-11-19 MED ORDER — LACTATED RINGERS IV SOLN
500.0000 mL | INTRAVENOUS | Status: DC | PRN
  Administered 2016-11-19: 500 mL via INTRAVENOUS

## 2016-11-19 MED ORDER — ONDANSETRON HCL 4 MG/2ML IJ SOLN: 4.0000 mg | Freq: Four times a day (QID) | INTRAMUSCULAR | Status: DC | PRN

## 2016-11-19 MED ORDER — OXYTOCIN 40 UNITS IN LACTATED RINGERS INFUSION - SIMPLE MED: 1.0000 m[IU]/min | INTRAVENOUS | Status: DC

## 2016-11-19 MED ORDER — FENTANYL CITRATE (PF) 100 MCG/2ML IJ SOLN
100.0000 ug | INTRAMUSCULAR | Status: DC | PRN
  Administered 2016-11-19: 100 ug via INTRAVENOUS

## 2016-11-19 MED ORDER — LIDOCAINE HCL (PF) 1 % IJ SOLN
INTRAMUSCULAR | Status: DC | PRN
  Administered 2016-11-19 (×2): 5 mL via EPIDURAL

## 2016-11-19 MED ORDER — SENNOSIDES-DOCUSATE SODIUM 8.6-50 MG PO TABS
2.0000 | ORAL_TABLET | ORAL | Status: DC
  Administered 2016-11-19: 2 via ORAL
  Filled 2016-11-19: qty 2

## 2016-11-19 MED ORDER — SIMETHICONE 80 MG PO CHEW: 80.0000 mg | CHEWABLE_TABLET | ORAL | Status: DC | PRN

## 2016-11-19 MED ORDER — FENTANYL CITRATE (PF) 100 MCG/2ML IJ SOLN
INTRAMUSCULAR | Status: AC
  Administered 2016-11-19: 100 ug via INTRAVENOUS
  Filled 2016-11-19: qty 2

## 2016-11-19 NOTE — Progress Notes (Signed)
Patient ID: Emma Carlson, female   DOB: 02/24/1986, 31 y.o.   MRN: [id number] Vitals:   11/19/16 0601 11/19/16 0631 11/19/16 0702 11/19/16 0730  BP: 111/65 (!) 113/47 112/64 123/60  Pulse: 80 78 77 79  Resp:   17   Temp:   98.7 F (37.1 C)   TempSrc:   Oral   SpO2:      Weight:      Height:       FHR stable UCs irregular Dilation: 5.5 Effacement (%): 80, 90 Cervical Position: Middle Station: -1 Presentation: Vertex Exam by:: Wynelle BourgeoisMarie Kastiel Simonian, CNM  Continue to observe May need Pitocin augmentation

## 2016-11-19 NOTE — Progress Notes (Signed)
Spoke to patient about her history of prior c/s. She [place] [facility] about [facility]. She was told that baby did not descend due to a short cord. There was a nuchal and a knot in the umbilical cord. Patient dialated to 10cm. Had trial of pushing for 2 hours but fetal intolerance and no decent. Otherwise she hand an uncomplicated c/s per patient. She was not told to never labor again. Will try and get records from this hospilization. Pateint signed release.   Discussed risk and benefits of TOLAC since patient was not aware. [facility]   Caryl AdaJazma Phuong Moffatt, DO OB Fellow Faculty Practice, [facility] 11/19/2016, 12:34 PM

## 2016-11-19 NOTE — Anesthesia Pain Management Evaluation Note (Signed)
  CRNA Pain Management Visit Note  Patient: Emma Carlson, 31 y.o., female  "Hello I am a member of the anesthesia team at [facility]. We have an anesthesia team available at all times to provide care throughout the hospital, including epidural management and anesthesia for C-section. I don't know your plan for the delivery whether it a natural birth, water birth, IV sedation, nitrous supplementation, doula or epidural, but we want to meet your pain goals."   1.Was your pain managed to your expectations on prior hospitalizations?   No   2.What is your expectation for pain management during this hospitalization?     Epidural  3.How can we help you reach that goal? Pt currently comfortable with epidural  Record the patient's initial score and the patient's pain goal.   Pain: 0  Pain Goal: 0 [facility] wants you to be able to say your pain was always managed very well.  Denzal Meir 11/19/2016

## 2016-11-19 NOTE — MAU Note (Signed)
Pt reports contractions every 5mins. States she had a small trickle of clear fluid run down her leg at 11pm-unsure if water broke. Pt is not wearing a pad in triage. Reports good fetal movement. Denies vaginal bleeding. States cervix was 2cm on Friday.

## 2016-11-19 NOTE — Anesthesia Preprocedure Evaluation (Signed)
Anesthesia Evaluation  Patient identified by MRN, date of birth, ID band Patient awake    Reviewed: Allergy & Precautions, H&P , NPO status , Patient's Chart, lab work & pertinent test results  Airway Mallampati: II  TM Distance: >3 FB Neck ROM: full    Dental no notable dental hx. (+) Teeth Intact   Pulmonary former smoker,    Pulmonary exam normal breath sounds clear to auscultation       Cardiovascular negative cardio ROS Normal cardiovascular exam Rhythm:regular Rate:Normal     Neuro/Psych negative neurological ROS  negative psych ROS   GI/Hepatic negative GI ROS, Neg liver ROS,   Endo/Other  negative endocrine ROS  Renal/GU negative Renal ROS     Musculoskeletal negative musculoskeletal ROS (+)   Abdominal (+) + obese,   Peds  Hematology   Anesthesia Other Findings   Reproductive/Obstetrics (+) Pregnancy                             Anesthesia Physical Anesthesia Plan  ASA: II  Anesthesia Plan: Epidural   Post-op Pain Management:    Induction:   PONV Risk Score and Plan:   Airway Management Planned:   Additional Equipment:   Intra-op Plan:   Post-operative Plan:   Informed Consent: I have reviewed the patients History and Physical, chart, labs and discussed the procedure including the risks, benefits and alternatives for the proposed anesthesia with the patient or authorized representative who has indicated his/her understanding and acceptance.     Plan Discussed with:   Anesthesia Plan Comments:         Anesthesia Quick Evaluation

## 2016-11-19 NOTE — Anesthesia Procedure Notes (Signed)
Epidural Patient location during procedure: OB Start time: 11/19/2016 2:31 AM End time: 11/19/2016 2:36 AM  Staffing Anesthesiologist: Leilani AbleHATCHETT, Kataleyah Carducci Performed: anesthesiologist   Preanesthetic Checklist Completed: patient identified, surgical consent, pre-op evaluation, timeout performed, IV checked, risks and benefits discussed and monitors and equipment checked  Epidural Patient position: sitting Prep: site prepped and draped and DuraPrep Patient monitoring: continuous pulse ox and blood pressure Approach: midline Location: L3-L4 Injection technique: LOR air  Needle:  Needle type: Tuohy  Needle gauge: 17 G Needle length: 9 cm and 9 Needle insertion depth: 6 cm Catheter type: closed end flexible Catheter size: 19 Gauge Catheter at skin depth: 11 cm Test dose: negative and Other  Assessment Sensory level: T9 Events: blood not aspirated, injection not painful, no injection resistance, negative IV test and no paresthesia  Additional Notes Reason for block:procedure for pain

## 2016-11-19 NOTE — H&P (Signed)
Emma Carlson is a 31 y.o. female presenting for contractions and leaking of fluid..  RN Note: Pt reports contractions every . States she had a small trickle of clear fluid run down her leg at 11pm-unsure if water broke. Pt is not wearing a pad in triage. Reports good fetal movement. Denies vaginal bleeding. States cervix was 2cm on Friday.  Patient Active Problem List   Diagnosis Date Noted  . PROM (premature rupture of membranes) 11/19/2016  . History of cesarean delivery, antepartum 11/02/2016  . GBS (group B Streptococcus carrier), +RV culture, currently pregnant 10/29/2016  . Anemia affecting pregnancy in third trimester 08/19/2016  . Back pain affecting pregnancy in second trimester 06/26/2016  . Supervision of normal pregnancy, antepartum 05/07/2016     OB History    Gravida Para Term Preterm AB Living   3 1 1  0 1 1   SAB TAB Ectopic Multiple Live Births   1 0 0 0 1     Past Medical History:  Diagnosis Date  . Abortion, nontherapeutic 2004   Elective at 18 weeks   . Nausea    mild in the pastt several weeks   . Strep throat    recurrent approx. 3 times in the past year   . Tonsillitis 2011   Severe - hosptalised in overnight in [place]    Past Surgical History:  Procedure Laterality Date  . CESAREAN SECTION    . INDUCED ABORTION  2004   at 18 weeks (elective )  . MOUTH SURGERY     Family History: family history includes Cancer (age of onset: 6) in her mother. Social History:  reports that she has quit smoking. She has never used smokeless tobacco. She reports that she does not drink alcohol or use drugs.     Maternal Diabetes: No Genetic Screening: Normal Maternal Ultrasounds/Referrals: Normal Fetal Ultrasounds or other Referrals:  None Maternal Substance Abuse:  No Significant Maternal Medications:  None Significant Maternal Lab Results:  Lab values include: Group B Strep positive Other Comments:  None  Review of Systems  Constitutional:  Negative for chills and fever.  Eyes: Negative for blurred vision.  Respiratory: Negative for shortness of breath.   Cardiovascular: Negative for leg swelling.  Gastrointestinal: Positive for abdominal pain. Negative for constipation, diarrhea, nausea and vomiting.  Musculoskeletal: Negative for back pain.  Neurological: Negative for dizziness.   Maternal Medical History:  Reason for admission: Rupture of membranes and contractions.  Nausea.  Contractions: Onset was 1-2 hours ago.   Frequency: irregular.   Perceived severity is moderate.    Fetal activity: Perceived fetal activity is normal.   Last perceived fetal movement was within the past hour.    Prenatal complications: No pre-eclampsia or preterm labor.   Prenatal Complications - Diabetes: none.      Blood pressure 122/63, pulse 78, temperature 97.7 F (36.5 C), temperature source Oral, resp. rate 20, height 5' 3.5" (1.613 m), weight 200 lb (90.7 kg), last menstrual period 02/20/2016, SpO2 100 %. Maternal Exam:  Uterine Assessment: Contraction strength is moderate.  Contraction frequency is irregular.   Abdomen: Patient reports no abdominal tenderness. Fetal presentation: vertex  Introitus: Normal vulva. Normal vagina.  Ferning test: positive.  Nitrazine test: not done. Amniotic fluid character: clear.  Pelvis: adequate for delivery.   Cervix: Cervix evaluated by digital exam.     Fetal Exam Fetal Monitor Review: Mode: ultrasound.   Baseline rate: 140.  Variability: moderate (6-25 bpm).   Pattern: accelerations present and  no decelerations.    Fetal State Assessment: Category I - tracings are normal.     Physical Exam  Constitutional: She is oriented to person, place, and time. She appears well-developed and well-nourished. No distress.  HENT:  Head: Normocephalic.  Cardiovascular: Normal rate and regular rhythm.   Respiratory: Effort normal. No respiratory distress.  GI: Soft. She exhibits no  distension. There is no tenderness. There is no rebound and no guarding.  Musculoskeletal: Normal range of motion.  Neurological: She is alert and oriented to person, place, and time.  Skin: Skin is warm and dry.  Psychiatric: She has a normal mood and affect.    Prenatal labs: ABO, Rh: A/Positive/-- (02/21 1534) Antibody: Negative (02/21 1534) Rubella: 4.31 (02/21 1534) RPR: Non Reactive (06/04 1037)  HBsAg: Negative (02/21 1534)  HIV:    GBS: Positive (08/09 0935)   Assessment/Plan: Single IUP at 694w6d Early labor PROM at term  [facility] Rout[facility] orders Will observe for now Wynelle BourgeoisMarie Brianni Manthe 11/19/2016, 1:29 AM

## 2016-11-19 NOTE — Progress Notes (Signed)
Emma Carlson is a 31 y.o. Emma Carlson at 4479w6d admitted in active labor.  She is undergoing TOLAC.  Her prior cesarean section was secondary to failure to descent and NRFHT; there was nuchal cord.  Baby was ~7lb per mother's report.  Subjective: Doing well.  Comfortable with epidural in place.  FOB at bedside.   Objective: BP 116/72   Pulse 95   Temp 98.9 F (37.2 C) (Oral)   Resp 16   Ht 5' 3.5" (1.613 m)   Wt 90.7 kg (200 lb)   LMP 02/20/2016   SpO2 99%   BMI 34.87 kg/m  No intake/output data recorded. Total I/O In: -  Out: 275 [Urine:275]  FHT:  Baseline 145, moderate variability, +accels, one variable decel UC:   regular, every 2-5 minutes SVE:   Dilation: 6.5 Effacement (%): 70, 80 (thick anterior lip) Station: -1 Exam by:: Dr. Phelps/ D. Manson PasseyBrown [facility]  Labs: Lab Results  Component Value Date   WBC 21.2 (H) 11/19/2016   HGB 12.0 11/19/2016   HCT 34.3 (L) 11/19/2016   MCV 86.0 11/19/2016   PLT 284 11/19/2016    Assessment / Plan: Spontaneous labor, progressing normally  Labor: Progressing normally, will continue to monitor and consider augmentation with pitocin pending continued progress Preeclampsia:  N/A Fetal Wellbeing: Category 2 Pain Control:  Epidural I/D:  n/a Anticipated MOD:  NSVD  Emma Carlson 11/19/2016, 10:28 AM

## 2016-11-20 ENCOUNTER — Encounter (HOSPITAL_COMMUNITY): Payer: Self-pay | Admitting: *Deleted

## 2016-11-20 MED ORDER — IBUPROFEN 600 MG PO TABS: 600.0000 mg | ORAL_TABLET | Freq: Four times a day (QID) | ORAL | 0 refills | Status: DC

## 2016-11-20 MED ORDER — NORETHINDRONE 0.35 MG PO TABS: 1.0000 | ORAL_TABLET | Freq: Every day | ORAL | 11 refills | Status: DC

## 2016-11-20 MED ORDER — ACETAMINOPHEN 325 MG PO TABS: 650.0000 mg | ORAL_TABLET | ORAL | 0 refills | Status: DC | PRN

## 2016-11-20 NOTE — Discharge Summary (Signed)
OB Discharge Summary     Patient Name: Emma Carlson DOB: [birth year] MRN: [id number]  Date of admission: 11/19/2016 Delivering MD: Pincus Large   Date of discharge: 11/20/2016  Admitting diagnosis: 40 WEEKS CTX Intrauterine pregnancy: [redacted]w[redacted]d     Secondary diagnosis:  Active Problems:   PROM (premature rupture of membranes)   VBAC (vaginal birth after Cesarean)  Additional problems: none     Discharge diagnosis: Term Pregnancy Delivered and VBAC                                                                                                Post partum procedures:none  Augmentation: none  Complications: None  Hospital course:  Onset of Labor With Vaginal Delivery     31 y.o. yo [id number] at [redacted]w[redacted]d was admitted in Latent Labor on 11/19/2016. Patient had an uncomplicated labor course as follows:  Membrane Rupture Time/Date: 11:00 PM ,11/18/2016   Intrapartum Procedures: Episiotomy: None [1]                                         Lacerations:  None [1]  Patient had a delivery of a Viable infant. 11/19/2016  Information for the patient's newborn:  Trinidee, Schrag [[id number]]  Delivery Method: VBAC, Spontaneous (Filed from Delivery Summary)    Pateint had an uncomplicated postpartum course.  She is ambulating, tolerating a regular diet, passing flatus, and urinating well. Patient is discharged home in stable condition on 11/20/16.   Physical exam  Vitals:   11/20/16 0321 11/20/16 0627 11/20/16 0855 11/20/16 1830  BP:  (!) 97/53 (!) 122/56 (!) 123/57  Pulse:  75 78 87  Resp:  Temp: 98.8 F (37.1 C)  98.6 F (37 C) 98 F (36.7 C)  TempSrc: Oral  Oral Oral  SpO2:   99%   Weight:      Height:       General: alert, cooperative and no distress Lochia: appropriate Uterine Fundus: firm Incision: N/A DVT Evaluation: No evidence of DVT seen on physical exam. Labs: Lab Results  Component Value Date   WBC 21.2 (H) 11/19/2016   HGB 12.0 11/19/2016   HCT 34.3 (L)  11/19/2016   MCV 86.0 11/19/2016   PLT 284 11/19/2016   CMP Latest Ref Rng & Units 10/17/2015  Glucose 65 - 99 mg/dL 76  BUN 7 - 25 mg/dL 7  Creatinine 6.96 - 2.95 mg/dL 2.84  Sodium 132 - 440 mmol/L 140  Potassium 3.5 - 5.3 mmol/L 3.7  Chloride 98 - 110 mmol/L 106  CO2 20 - 31 mmol/L 22  Calcium 8.6 - 10.2 mg/dL 8.9  Total Protein 6.1 - 8.1 g/dL 7.3  Total Bilirubin 0.2 - 1.2 mg/dL 0.4  Alkaline Phos 33 - 115 U/L 58  AST 10 - 30 U/L 16  ALT 6 - 29 U/L 14    Discharge instruction: per After Visit Summary and "Baby and Me Booklet".  After visit meds:  Allergies as of  11/20/2016   No Known Allergies     Medication List    STOP taking these medications   COMFORT FIT MATERNITY SUPP SM Misc     TAKE these medications   acetaminophen 325 MG tablet Commonly known as:  TYLENOL Take 2 tablets (650 mg total) by mouth every 4 (four) hours as needed (for pain scale < 4). What changed:  when to take this  reasons to take this   CITRANATAL BLOOM 90-1 MG Tabs Take 1 tablet by mouth daily.   ibuprofen 600 MG tablet Commonly known as:  ADVIL,MOTRIN Take 1 tablet (600 mg total) by mouth every 6 (six) hours.   metroNIDAZOLE 500 MG tablet Commonly known as:  FLAGYL Take 1 tablet (500 mg total) by mouth 2 (two) times daily.   norethindrone 0.35 MG tablet Commonly known as:  ORTHO MICRONOR Take 1 tablet (0.35 mg total) by mouth daily.   prenatal multivitamin Tabs tablet Take 1 tablet by mouth daily at 12 noon.   Vitamin D (Ergocalciferol) 50000 units Caps capsule Commonly known as:  DRISDOL Take 50,000 Units by mouth every 7 (seven) days.            Discharge Care Instructions        Start     Ordered   11/20/16 0000  acetaminophen (TYLENOL) 325 MG tablet  Every 4 hours PRN     11/20/16 0744   11/20/16 0000  ibuprofen (ADVIL,MOTRIN) 600 MG tablet  Every 6 hours     11/20/16 0744   11/20/16 0000  norethindrone (ORTHO MICRONOR) 0.35 MG tablet  Daily    Comments:   Start pills on the Sunday when the baby is 493 weeks old  Question:  Supervising Provider  Answer:  CONSTANT, PEGGY   11/20/16 2026   11/19/16 0000  OB RESULTS CONSOLE GC/Chlamydia    Comments:  This external order was created through the Results Console.   11/19/16 1927      Diet: routine diet  Activity: Advance as tolerated. Pelvic rest for 6 weeks.   Outpatient follow up:5 weeks Follow up Appt:No future appointments. Follow up Visit:No Follow-up on file.  Postpartum contraception: Progesterone only pills  Newborn Data: Live born female  Birth Weight: 8 lb 5 oz (3771 g) APGAR: 8, 9  Baby Feeding: Breast Disposition:home with mother   11/20/2016 Greig RightRESENZO-DISHMAN,Rajean Desantiago, CNM

## 2016-11-20 NOTE — Progress Notes (Signed)
Post Partum Day 1  Subjective:  Emma Carlson is a 31 y.o. [id number] 1245w6d s/p SOL with VBAC.  No acute events overnight.  Pt denies problems with ambulating, voiding or po intake.  She denies nausea or vomiting.  Pain is well controlled.  She has had flatus. She has not had bowel movement.  Lochia Moderate.  Plan for birth control is oral progesterone-only contraceptive.  Method of Feeding: breast  Objective: BP (!) 97/53 (BP Location: Right Arm)   Pulse 75   Temp 98.8 F (37.1 C) (Oral)   Resp 18   Ht 5' 3.5" (1.613 m)   Wt 90.7 kg (200 lb)   LMP 02/20/2016   SpO2 99%   Breastfeeding? Unknown   BMI 34.87 kg/m   Physical Exam:  General: alert, cooperative and no distress Lochia:normal flow Chest: no increased wob Abdomen: soft, nontender, fundus firm at/below umbilicus Uterine Fundus: firm DVT Evaluation: No evidence of DVT seen on physical exam. Extremities: No edema   Recent Labs  11/19/16 0140  HGB 12.0  HCT 34.3*    Assessment/Plan:  ASSESSMENT: Emma Carlson is a 31 y.o. [id number] 3745w6d ppd #1 s/p NSVD doing well.   Plan for discharge tomorrow   LOS: 1 day   Odalys Win C. Frances FurbishWinfrey, MD PGY-1, [facility] 11/20/2016 8:30 AM

## 2016-11-20 NOTE — Discharge Instructions (Signed)
Postpartum Care After Vaginal Delivery °The period of time right after you deliver your newborn is called the postpartum period. °What kind of medical care will I receive? °· You may continue to receive fluids and medicines through an IV tube inserted into one of your veins. °· If an incision was made near your vagina (episiotomy) or if you had some vaginal tearing during delivery, cold compresses may be placed on your episiotomy or your tear. This helps to reduce pain and swelling. °· You may be given a squirt bottle to use when you go to the bathroom. You may use this until you are comfortable wiping as usual. To use the squirt bottle, follow these steps: °? Before you urinate, fill the squirt bottle with warm water. Do not use hot water. °? After you urinate, while you are sitting on the toilet, use the squirt bottle to rinse the area around your urethra and vaginal opening. This rinses away any urine and blood. °? You may do this instead of wiping. As you start healing, you may use the squirt bottle before wiping yourself. Make sure to wipe gently. °? Fill the squirt bottle with clean water every time you use the bathroom. °· You will be given sanitary pads to wear. °How can I expect to feel? °· You may not feel the need to urinate for several hours after delivery. °· You will have some soreness and pain in your abdomen and vagina. °· If you are breastfeeding, you may have uterine contractions every time you breastfeed for up to several weeks postpartum. Uterine contractions help your uterus return to its normal size. °· It is normal to have vaginal bleeding (lochia) after delivery. The amount and appearance of lochia is often similar to a menstrual period in the first week after delivery. It will gradually decrease over the next few weeks to a dry, yellow-brown discharge. For most women, lochia stops completely by 6-8 weeks after delivery. Vaginal bleeding can vary from woman to woman. °· Within the first few  days after delivery, you may have breast engorgement. This is when your breasts feel heavy, full, and uncomfortable. Your breasts may also throb and feel hard, tightly stretched, warm, and tender. After this occurs, you may have milk leaking from your breasts. Your health care provider can help you relieve discomfort due to breast engorgement. Breast engorgement should go away within a few days. °· You may feel more sad or worried than normal due to hormonal changes after delivery. These feelings should not last more than a few days. If these feelings do not go away after several days, speak with your health care provider. °How should I care for myself? °· Tell your health care provider if you have pain or discomfort. °· Drink enough water to keep your urine clear or pale yellow. °· Wash your hands thoroughly with soap and water for at least 20 seconds after changing your sanitary pads, after using the toilet, and before holding or feeding your baby. °· If you are not breastfeeding, avoid touching your breasts a lot. Doing this can make your breasts produce more milk. °· If you become weak or lightheaded, or you feel like you might faint, ask for help before: °? Getting out of bed. °? Showering. °· Change your sanitary pads frequently. Watch for any changes in your flow, such as a sudden increase in volume, a change in color, the passing of large blood clots. If you pass a blood clot from your vagina, save it   to show to your health care provider. Do not flush blood clots down the toilet without having your health care provider look at them.  Make sure that all your vaccinations are up to date. This can help protect you and your baby from getting certain diseases. You may need to have immunizations done before you leave the hospital.  If desired, talk with your health care provider about methods of family planning or birth control (contraception). How can I start bonding with my baby? Spending as much time as  possible with your baby is very important. During this time, you and your baby can get to know each other and develop a bond. Having your baby stay with you in your room (rooming in) can give you time to get to know your baby. Rooming in can also help you become comfortable caring for your baby. Breastfeeding can also help you bond with your baby. How can I plan for returning home with my baby?  Make sure that you have a car seat installed in your vehicle. ? Your car seat should be checked by a certified car seat installer to make sure that it is installed safely. ? Make sure that your baby fits into the car seat safely.  Ask your health care provider any questions you have about caring for yourself or your baby. Make sure that you are able to contact your health care provider with any questions after leaving the hospital. This information is not intended to replace advice given to you by your health care provider. Make sure you discuss any questions you have with your health care provider. Document Released: 12/29/2006 Document Revised: 08/06/2015 Document Reviewed: 02/05/2015 Elsevier Interactive Patient Education  2018 [employer] Breast Pumping Tips If you are breastfeeding, there may be times when you cannot feed your baby directly. Returning to work or going on a trip are examples. Pumping allows you to store breast milk and feed it to your baby later. You may not get much milk when you first start to pump. Your breasts should start to make more after a few days. If you pump at the times you usually feed your baby, you may be able to keep making enough milk to feed your baby without also using formula. The more often you pump, the more milk your body will make. When should I pump?  You can start to pump soon after you have your baby. Ask your doctor what is right for you and your baby.  If you are going back to work, start pumping a few weeks before. This gives you time to learn how to  pump and to store a supply of milk.  When you are with your baby, feed your baby when he or she is hungry. Pump after each feeding.  When you are away from your baby for many hours, pump for about 15 minutes every 2-3 hours. Pump both breasts at the same time if you can.  If your baby has a formula feeding, make sure to pump close to the same time.  If you drink any alcohol, wait 2 hours before pumping. How do I get ready to pump? Your let-down reflex is your body's natural reaction that makes your breast milk flow. It is easier to make your breast milk flow when you are relaxed. Try these things to help you relax:  Smell one of your baby's blankets or an item of clothing.  Look at a picture or video of your baby.  Sit  in a quiet, private space.  Massage the breast you plan to pump.  Place soothing warmth on the breast.  Play relaxing music.  What are some breast pumping tips?  Wash your hands before you pump. You do not need to wash your nipples or breasts.  There are three ways to pump. You can: ? Use your hand to massage and squeeze your breast. ? Use a handheld manual pump. ? Use an electric pump.  Make sure the suction cup on the breast pump is the right size. Place the suction cup directly over the nipple. It can be painful or hurt your nipple if it is the wrong size or placed wrong.  Put a small amount of purified or modified lanolin on your nipple and areola if you are sore.  If you are using an electric pump, change the speed and suction power to be more comfortable.  You may need a different type of pump if pumping hurts or you do not get a lot of milk. Your doctor can help you pick what type of pump to use.  Keep a full water bottle near you always. Drinking lots of fluid helps you make more milk.  You can store your milk to use later. Pumped breast milk can be stored in a sealable, sterile container or plastic bag. Always put the date you pumped it on the  container. ? Milk can stay out at room temperature for up to 8 hours. ? You can store your milk in the refrigerator for up to 8 days. ? You can store your milk in the freezer for 3 months. Thaw frozen milk using warm water. Do not put it in the microwave.  Do not smoke. Ask your doctor for help. When should I call my doctor?  You have a hard time pumping.  You are worried you do not make enough milk.  You have nipple pain, soreness, or redness.  You want to take birth control pills. This information is not intended to replace advice given to you by your health care provider. Make sure you discuss any questions you have with your health care provider. Document Released: 08/20/2007 Document Revised: 08/09/2015 Document Reviewed: 12/24/2012 Elsevier Interactive Patient Education  2017 [employer].

## 2016-11-20 NOTE — Anesthesia Postprocedure Evaluation (Signed)
Anesthesia Post Note  Patient: Emma Carlson  Procedure(s) Performed: * No procedures listed *     Patient location during evaluation: Mother Baby Anesthesia Type: Epidural Level of consciousness: awake Pain management: pain level controlled Vital Signs Assessment: post-procedure vital signs reviewed and stable Respiratory status: spontaneous breathing Cardiovascular status: stable Postop Assessment: no headache, no backache, epidural receding and patient able to bend at knees Anesthetic complications: no    Last Vitals:  Vitals:   11/20/16 0321 11/20/16 0627  BP:  (!) 97/53  Pulse:  75  Resp:  18  Temp: 37.1 C   SpO2:      Last Pain:  Vitals:   11/20/16 0627  TempSrc:   PainSc: 4    Pain Goal:                 Edison PaceWILKERSON,[name]

## 2016-12-23 ENCOUNTER — Ambulatory Visit (INDEPENDENT_AMBULATORY_CARE_PROVIDER_SITE_OTHER): Payer: BLUE CROSS/BLUE SHIELD | Admitting: Certified Nurse Midwife

## 2016-12-23 ENCOUNTER — Encounter: Payer: Self-pay | Admitting: Certified Nurse Midwife

## 2016-12-23 MED ORDER — NORETHINDRONE 0.35 MG PO TABS: 1.0000 | ORAL_TABLET | Freq: Every day | ORAL | 11 refills | Status: DC

## 2016-12-23 NOTE — Progress Notes (Signed)
Post Partum Exam  Emma Carlson is a 31 y.o. [phone] female who presents for a postpartum visit. She is 4 weeks postpartum following a spontaneous vaginal delivery. I have fully reviewed the prenatal and intrapartum course. The delivery was at [redacted]w[redacted]d gestational weeks.  Anesthesia: epidural. Postpartum course has been great. Baby's course has been great. Baby is feeding by bottle - Similac Advance. Bleeding thin lochia. Bowel function is normal. Bladder function is normal. Patient is not sexually active. Contraception method is oral progesterone-only contraceptive. Postpartum depression screening:neg  The following portions of the patient's history were reviewed and updated as appropriate: allergies, current medications, past family history, past medical history, past social history, past surgical history and problem list.  Review of Systems Pertinent items noted in HPI and remainder of comprehensive ROS otherwise negative.    Objective:  unknown if currently breastfeeding.  General:  alert, cooperative and no distress   Breasts:  inspection negative, no nipple discharge or bleeding, no masses or nodularity palpable  Lungs: clear to auscultation bilaterally  Heart:  regular rate and rhythm, S1, S2 normal, no murmur, click, rub or gallop  Abdomen: soft, non-tender; bowel sounds normal; no masses,  no organomegaly  Pelvic Exam: Not performed.        Assessment:    Normal 4 week postpartum exam. Pap smear not done at today's visit.   contraception management: POP ordered   Breast feeding status: doing well.   Plan:   1. Contraception: abstinence and oral progesterone-only contraceptive 2. F/U in 2 weeks for annual exam with pap smear.  POP sent to pharmacy.  Breast feeding status: pumping.

## 2017-01-06 ENCOUNTER — Ambulatory Visit (INDEPENDENT_AMBULATORY_CARE_PROVIDER_SITE_OTHER): Payer: BLUE CROSS/BLUE SHIELD | Admitting: Certified Nurse Midwife

## 2017-01-06 ENCOUNTER — Other Ambulatory Visit (HOSPITAL_COMMUNITY)
Admission: RE | Admit: 2017-01-06 | Discharge: 2017-01-06 | Disposition: A | Discharge status: Home / Self Care | Payer: BLUE CROSS/BLUE SHIELD | Source: Ambulatory Visit | Attending: Certified Nurse Midwife | Admitting: Certified Nurse Midwife

## 2017-01-06 ENCOUNTER — Encounter: Payer: Self-pay | Admitting: Certified Nurse Midwife

## 2017-01-06 VITALS — BP 142/92 | HR 67 | Ht 63.5 in | Wt 179.0 lb

## 2017-01-06 DIAGNOSIS — Z01419 Encounter for gynecological examination (general) (routine) without abnormal findings: Secondary | ICD-10-CM

## 2017-01-06 DIAGNOSIS — N898 Other specified noninflammatory disorders of vagina: Secondary | ICD-10-CM | POA: Insufficient documentation

## 2017-01-06 DIAGNOSIS — Z803 Family history of malignant neoplasm of breast: Secondary | ICD-10-CM | POA: Insufficient documentation

## 2017-01-06 DIAGNOSIS — Z800 Family history of malignant neoplasm of digestive organs: Secondary | ICD-10-CM

## 2017-01-06 DIAGNOSIS — J020 Streptococcal pharyngitis: Secondary | ICD-10-CM | POA: Insufficient documentation

## 2017-01-06 DIAGNOSIS — K5909 Other constipation: Secondary | ICD-10-CM

## 2017-01-06 DIAGNOSIS — Z30016 Encounter for initial prescription of transdermal patch hormonal contraceptive device: Secondary | ICD-10-CM

## 2017-01-06 MED ORDER — NORELGESTROMIN-ETH ESTRADIOL 150-35 MCG/24HR TD PTWK: 1.0000 | MEDICATED_PATCH | TRANSDERMAL | 12 refills | Status: DC

## 2017-01-06 NOTE — Progress Notes (Signed)
Subjective:        Emma Carlson is a 31 y.o. female here for a routine exam.  Current complaints:  Had a successful VBAC delivery 11/19/16.   Currently stopped breast feeding due to lack of supply. Is taking Micronor for birth control, desires not to take a pill daily, had tried Depo injections in the past.  Declines STD testing.  Genetic cancer screening discussed in depth, desires to start testing.  Her mother passed last year from cancers: Breast, leukemia, stomach cancers.   Personal health questionnaire:  Is patient Ashkenazi Jewish, have a family history of breast and/or ovarian cancer: yes Is there a family history of uterine cancer diagnosed at age < 59, gastrointestinal cancer, urinary tract cancer, family member who is a Personnel officer syndrome-associated carrier: no Is the patient overweight and hypertensive, family history of diabetes, personal history of gestational diabetes, preeclampsia or PCOS: yes Is patient over 62, have PCOS,  family history of premature CHD under age 56, diabetes, smoke, have hypertension or peripheral artery disease:  no At any time, has a partner hit, kicked or otherwise hurt or frightened you?: no Over the past 2 weeks, have you felt down, depressed or hopeless?: no Over the past 2 weeks, have you felt little interest or pleasure in doing things?:no   Gynecologic History No LMP recorded. Contraception: oral progesterone-only contraceptive Last Pap: 10/17/15. Results were: normal Last mammogram: n/a <40.  Obstetric History OB History  Gravida Para Term Preterm AB Living  4 2 2  0 2 2  SAB TAB Ectopic Multiple Live Births  1 1 0 0 2    # Outcome Date GA Lbr Len/2nd Weight Sex Delivery Anes PTL Lv  4 Term 11/19/16 [redacted]w[redacted]d 20:04 / 00:32 8 lb 5 oz (3.771 kg) F VBAC EPI  LIV     Birth Comments: none  3 Term 10/23/11 [redacted]w[redacted]d   F CS-LTranv   LIV     Complications: Nuchal cord affecting delivery     Birth Comments: System Generated. Please review and update  pregnancy details.  2 SAB 05/29/10 [redacted]w[redacted]d         1 TAB 11/20/02 [redacted]w[redacted]d             Past Medical History:  Diagnosis Date  . Abortion, nontherapeutic 2004   Elective at 18 weeks   . Nausea    mild in the pastt several weeks   . Strep throat    recurrent approx. 3 times in the past year   . Tonsillitis 2011   Severe - hosptalised in overnight in [place]     Past Surgical History:  Procedure Laterality Date  . CESAREAN SECTION    . INDUCED ABORTION  2004   at 18 weeks (elective )  . MOUTH SURGERY       Current Outpatient Prescriptions:  .  Prenatal Vit-Fe Fumarate-FA (PRENATAL MULTIVITAMIN) TABS tablet, Take 1 tablet by mouth daily at 12 noon., Disp: , Rfl:  .  Vitamin D, Ergocalciferol, (DRISDOL) 50000 units CAPS capsule, Take 50,000 Units by mouth every 7 (seven) days. , Disp: , Rfl:  .  norelgestromin-ethinyl estradiol Burr Medico) 150-35 MCG/24HR transdermal patch, Place 1 patch onto the skin once a week., Disp: 3 patch, Rfl: 12 No Known Allergies  Social History  Substance Use Topics  . Smoking status: Former Games developer  . Smokeless tobacco: Never Used  . Alcohol use No    Family History  Problem Relation Age of Onset  . Breast cancer Mother   .  Colon cancer Mother 4850       bone marrow       Review of Systems  Constitutional: negative for fatigue and weight loss Respiratory: negative for cough and wheezing Cardiovascular: negative for chest pain, fatigue and palpitations Gastrointestinal: negative for abdominal pain and change in bowel habits Musculoskeletal:negative for myalgias Neurological: negative for gait problems and tremors Behavioral/Psych: negative for abusive relationship, depression Endocrine: negative for temperature intolerance    Genitourinary:negative for abnormal menstrual periods, genital lesions, hot flashes, sexual problems and vaginal discharge Integument/breast: negative for breast lump, breast tenderness, nipple discharge and skin lesion(s)     Objective:       BP (!) 142/92   Pulse 67   Ht 5' 3.5" (1.613 m)   Wt 179 lb (81.2 kg)   Breastfeeding? Yes   BMI 31.21 kg/m  General:   alert  Skin:   no rash or abnormalities  Lungs:   clear to auscultation bilaterally  Heart:   regular rate and rhythm, S1, S2 normal, no murmur, click, rub or gallop  Breasts:   normal without suspicious masses, skin or nipple changes or axillary nodes  Abdomen:  normal findings: no organomegaly, soft, non-tender and no hernia  Pelvis:  External genitalia: normal general appearance Urinary system: urethral meatus normal and bladder without fullness, nontender Vaginal: normal without tenderness, induration or masses Cervix: normal appearance Adnexa: normal bimanual exam Uterus: anteverted and non-tender, normal size   Lab Review Urine pregnancy test Labs reviewed yes Radiologic studies reviewed no  50% of 45 min visit spent on counseling and coordination of care.    Assessment & Plan    Healthy female exam.  1. Chronic constipation    - Ambulatory referral to Gastroenterology  2. Family history of breast cancer in mother    - BRCAssure Comprehensive Test  3. Well woman exam     - Cytology - PAP  4. Vaginal discharge    - Cervicovaginal ancillary only  5. Encounter for initial prescription of transdermal patch hormonal contraceptive device    - norelgestromin-ethinyl estradiol Burr Medico(XULANE) 150-35 MCG/24HR transdermal patch; Place 1 patch onto the skin once a week.  Dispense: 3 patch; Refill: 12     Education reviewed: calcium supplements, depression evaluation, low fat, low cholesterol diet, safe sex/STD prevention, self breast exams, skin cancer screening and weight bearing exercise. Contraception: Ortho-Evra patches weekly. Follow up in: 10 year.   Meds ordered this encounter  Medications  . norelgestromin-ethinyl estradiol Burr Medico(XULANE) 150-35 MCG/24HR transdermal patch    Sig: Place 1 patch onto the skin once a week.     Dispense:  3 patch    Refill:  12   Orders Placed This Encounter  Procedures  . BRCAssure Comprehensive Test  . VistaSeq Colorectal Cancer  . Ambulatory referral to Gastroenterology    Referral Priority:   Routine    Referral Type:   Consultation    Referral Reason:   Specialty Services Required    Number of Visits Requested:   1

## 2017-01-06 NOTE — Progress Notes (Signed)
Patient is in the office for annual, last pap 10-2015. Pt declined std testing.

## 2017-01-07 ENCOUNTER — Telehealth: Payer: Self-pay

## 2017-01-07 LAB — CERVICOVAGINAL ANCILLARY ONLY
Bacterial vaginitis: POSITIVE — AB
Candida vaginitis: NEGATIVE

## 2017-01-07 NOTE — Telephone Encounter (Signed)
TC from "[facility]" states BRCA 1 and 2 testing along with Lynch Panel is in the VistaSeq Colorectal Cancer screening test. Therefore, we will cancel the separate BRCA test to avoid insurance denial.

## 2017-01-08 ENCOUNTER — Other Ambulatory Visit: Payer: Self-pay | Admitting: Certified Nurse Midwife

## 2017-01-08 DIAGNOSIS — N760 Acute vaginitis: Principal | ICD-10-CM

## 2017-01-08 DIAGNOSIS — B9689 Other specified bacterial agents as the cause of diseases classified elsewhere: Secondary | ICD-10-CM

## 2017-01-08 DIAGNOSIS — B3731 Acute candidiasis of vulva and vagina: Secondary | ICD-10-CM

## 2017-01-08 DIAGNOSIS — B373 Candidiasis of vulva and vagina: Secondary | ICD-10-CM

## 2017-01-08 MED ORDER — TERCONAZOLE 0.8 % VA CREA: 1.0000 | TOPICAL_CREAM | Freq: Every day | VAGINAL | 0 refills | Status: DC

## 2017-01-08 MED ORDER — FLUCONAZOLE 200 MG PO TABS: 200.0000 mg | ORAL_TABLET | Freq: Once | ORAL | 0 refills | Status: AC

## 2017-01-08 MED ORDER — SECNIDAZOLE 2 G PO PACK: 1.0000 | PACK | Freq: Once | ORAL | 0 refills | Status: AC

## 2017-01-08 MED ORDER — METRONIDAZOLE 0.75 % VA GEL: 1.0000 | VAGINAL | 4 refills | Status: DC

## 2017-01-09 LAB — CYTOLOGY - PAP
Adequacy: ABSENT
DIAGNOSIS: NEGATIVE
HPV (WINDOPATH): NOT DETECTED

## 2017-01-09 LAB — REQUEST PROBLEM

## 2017-01-13 ENCOUNTER — Telehealth: Payer: Self-pay | Admitting: Pediatrics

## 2017-01-13 NOTE — Telephone Encounter (Signed)
[facility] requires PA  Approval# [id number]  Rx filled at pharmacy

## 2017-01-27 ENCOUNTER — Telehealth: Payer: Self-pay

## 2017-01-27 NOTE — Telephone Encounter (Signed)
Left vm to call about insurance denial for genetic testing.

## 2017-02-10 LAB — VISTASEQ COLORECTAL CANCER

## 2017-03-11 ENCOUNTER — Encounter: Payer: Self-pay | Admitting: Certified Nurse Midwife

## 2017-03-17 NOTE — L&D Delivery Note (Addendum)
Patient: Emma Carlson MRN: [id number]  GBS status: Positive, IAP given PCN, received 1.5 hours of antibiotics.   Patient is a 32 y.o. now [id number] s/p NSVD at [redacted]w[redacted]d, who was admitted for SOL. SROM 1h 61m prior to delivery with thick meconium stained fluid.    Delivery Note At 10:02 AM a viable female was delivered via VBAC, Spontaneous (Presentation:R OA).   APGAR: 8, 9; weight 8 lb 1.6 oz (3675 g).   Placenta status: Intact and spontaneous. Cord: 3 vessel and intact with the following complications: none.  Anesthesia:   Episiotomy: None Lacerations: None Suture Repair: N/A Est. Blood Loss (mL): 456  Mom to postpartum.  Baby to Couplet care / Skin to Skin.  Head delivered right OA. No nuchal cord present. Shoulder and body delivered in usual fashion, thick meconium amniotic fluid present. Infant with spontaneous cry, placed on mother's abdomen, dried and bulb suctioned. Cord clamped x 2 after 30-second delay, and cut by family member. Cord blood drawn. Placenta delivered spontaneously with gentle cord traction. Fundus firm with massage and Pitocin. Perineum inspected and found to have 2 superficial labial abrasions, which were found to be hemostatic.  Allayne [facility] PGY-1 12/25/2017, 12:24 PM  I confirm that I have verified the information documented in the resident's note and that I have also personally reperformed the physical exam and all medical decision making activities.   Luna Kitchens   Please schedule this patient for Postpartum visit in: 6 weeks with the following provider: Any provider For C/S patients schedule nurse incision check in weeks 2 weeks: no Low risk pregnancy complicated by: nothing Delivery mode:  SVD Anticipated Birth Control:  partner plans vasectomy PP Procedures needed: na  Schedule Integrated BH visit: no

## 2017-06-02 ENCOUNTER — Encounter: Payer: Self-pay | Admitting: *Deleted

## 2017-06-02 ENCOUNTER — Ambulatory Visit (INDEPENDENT_AMBULATORY_CARE_PROVIDER_SITE_OTHER): Payer: BLUE CROSS/BLUE SHIELD

## 2017-06-02 DIAGNOSIS — Z3201 Encounter for pregnancy test, result positive: Secondary | ICD-10-CM

## 2017-06-02 DIAGNOSIS — N912 Amenorrhea, unspecified: Secondary | ICD-10-CM

## 2017-06-02 LAB — POCT URINE PREGNANCY: Preg Test, Ur: POSITIVE — AB

## 2017-06-02 NOTE — Progress Notes (Signed)
I have reviewed this chart and agree with the RN/CMA assessment and management.    K. Meryl Kriss Perleberg, M.D. Attending Obstetrician & Gynecologist, [facility], [facility]  

## 2017-06-02 NOTE — Progress Notes (Signed)
[name] presents today for UPT. She has no unusual complaints. LMP: 03/30/17 aprx per pt     OBJECTIVE: Appears well, in no apparent distress.  OB History    Gravida Para Term Preterm AB Living   5 2 2  0 2 2   SAB TAB Ectopic Multiple Live Births   1 1 0 0 2     Home UPT Result: Positive  In-Office UPT result: Positive  I have reviewed the patient's medical, obstetrical, social, and family histories, and medications.   ASSESSMENT: Positive pregnancy test  PLAN Prenatal care to be completed at:  [facility]  PNV samples given

## 2017-06-10 DIAGNOSIS — Z3480 Encounter for supervision of other normal pregnancy, unspecified trimester: Secondary | ICD-10-CM | POA: Insufficient documentation

## 2017-06-11 ENCOUNTER — Encounter: Payer: Self-pay | Admitting: *Deleted

## 2017-06-11 ENCOUNTER — Encounter: Payer: Self-pay | Admitting: Certified Nurse Midwife

## 2017-06-11 ENCOUNTER — Ambulatory Visit (INDEPENDENT_AMBULATORY_CARE_PROVIDER_SITE_OTHER): Payer: BLUE CROSS/BLUE SHIELD | Admitting: Certified Nurse Midwife

## 2017-06-11 ENCOUNTER — Other Ambulatory Visit: Payer: Self-pay

## 2017-06-11 VITALS — BP 115/64 | HR 99 | Temp 99.0°F | Wt 181.5 lb

## 2017-06-11 DIAGNOSIS — Z3481 Encounter for supervision of other normal pregnancy, first trimester: Secondary | ICD-10-CM | POA: Diagnosis not present

## 2017-06-11 DIAGNOSIS — Z3480 Encounter for supervision of other normal pregnancy, unspecified trimester: Secondary | ICD-10-CM

## 2017-06-11 DIAGNOSIS — Z113 Encounter for screening for infections with a predominantly sexual mode of transmission: Secondary | ICD-10-CM

## 2017-06-11 DIAGNOSIS — O34219 Maternal care for unspecified type scar from previous cesarean delivery: Secondary | ICD-10-CM

## 2017-06-11 NOTE — Addendum Note (Signed)
Addended by: Orvilla CornwallENNEY, An Lannan A on: 06/11/2017 02:50 PM   Modules accepted: Orders

## 2017-06-11 NOTE — Progress Notes (Signed)
Subjective:   Emma Carlson is a 32 y.o. [id number] at [redacted]w[redacted]d by LMP being seen today for her first obstetrical visit.  Her obstetrical history is significant for obesity and previous C-section and successful VBAC last pregnancy. Patient does intend to breast feed. Pregnancy history fully reviewed.  Patient reports no complaints.  HISTORY: OB History  Gravida Para Term Preterm AB Living  5 2 2  0 2 2  SAB TAB Ectopic Multiple Live Births  1 1 0 0 2    # Outcome Date GA Lbr Len/2nd Weight Sex Delivery Anes PTL Lv  5 Current           4 Term 11/19/16 [redacted]w[redacted]d 20:04 / 00:32 8 lb 5 oz (3.771 kg) F VBAC EPI  LIV     Birth Comments: none     Name: [facility]     Apgar1: 8  Apgar5: 9  3 Term 10/23/11 [redacted]w[redacted]d   F CS-LTranv   LIV     Birth Comments: System Generated. Please review and update pregnancy details.     Complications: Nuchal cord affecting delivery  2 SAB 05/29/10 [redacted]w[redacted]d         1 TAB 11/20/02 [redacted]w[redacted]d           Last pap smear was done 01/06/17 and was normal  Past Medical History:  Diagnosis Date  . Abortion, nontherapeutic 2004   Elective at 18 weeks   . Anemia   . Nausea    mild in the pastt several weeks   . Strep throat    recurrent approx. 3 times in the past year   . Tonsillitis 2011   Severe - hosptalised in overnight in [place]    Past Surgical History:  Procedure Laterality Date  . CESAREAN SECTION    . INDUCED ABORTION  2004   at 18 weeks (elective )  . MOUTH SURGERY     Family History  Problem Relation Age of Onset  . Breast cancer Mother   . Colon cancer Mother 71       bone marrow   . Cancer Mother    Social History   Tobacco Use  . Smoking status: Former Games developer  . Smokeless tobacco: Never Used  Substance Use Topics  . Alcohol use: No  . Drug use: No   No Known Allergies Current Outpatient Medications on File Prior to Visit  Medication Sig Dispense Refill  . Prenatal Vit-Fe Fumarate-FA (PRENATAL MULTIVITAMIN) TABS tablet Take 1  tablet by mouth daily at 12 noon.    . Vitamin D, Ergocalciferol, (DRISDOL) 50000 units CAPS capsule Take 50,000 Units by mouth every 7 (seven) days.      No current facility-administered medications on file prior to visit.     Review of Systems Pertinent items noted in HPI and remainder of comprehensive ROS otherwise negative.  Exam   Vitals:   06/11/17 1005  BP: 115/64  Pulse: 99  Temp: 99 F (37.2 C)  Weight: 181 lb 8 oz (82.3 kg)   Fetal Heart Rate (bpm): 172; doppler  Uterus:     Pelvic Exam: Perineum: no hemorrhoids, normal perineum   Vulva: normal external genitalia, no lesions   Vagina:  normal mucosa, normal discharge   Cervix: no lesions and normal, pap smear done.    Adnexa: normal adnexa and no mass, fullness, tenderness   Bony Pelvis: average  System: General: well-developed, well-nourished female in no acute distress   Breast:  normal appearance, no masses or  tenderness   Skin: normal coloration and turgor, no rashes   Neurologic: oriented, normal, negative, normal mood   Extremities: normal strength, tone, and muscle mass, ROM of all joints is normal   HEENT PERRLA, extraocular movement intact and sclera clear, anicteric   Mouth/Teeth mucous membranes moist, pharynx normal without lesions and dental hygiene good   Neck supple and no masses   Cardiovascular: regular rate and rhythm   Respiratory:  no respiratory distress, normal breath sounds   Abdomen: soft, non-tender; bowel sounds normal; no masses,  no organomegaly    Limited US in office:  singleton pregnancy, normal appearing gestational sac, fundal placenta, CRL: 2.54cm EGA: 2142w3d  +fetal movement noted. +cardiac activity present  Based on today's ultrasound in office dating changed to EDD 01/11/18.     Assessment:   Pregnancy: [id number] Patient Active Problem List   Diagnosis Date Noted  . Supervision of other normal pregnancy, antepartum 06/10/2017  . Family history of breast cancer in mother  01/06/2017  . Chronic constipation 01/06/2017  . Family history- stomach cancer 01/06/2017  . Family history of colon cancer in mother 01/06/2017  . VBAC (vaginal birth after Cesarean) 11/19/2016  . History of cesarean delivery, antepartum 11/02/2016     Plan:  1. Supervision of other normal pregnancy, antepartum      Doing well      - Enroll Patient in Babyscripts - Obstetric Panel, Including HIV - Hemoglobinopathy evaluation - Culture, OB Urine - Cervicovaginal ancillary only - Genetic Screening  2. VBAC (vaginal birth after Cesarean)  TOLAC this pregnancy, paper work completed.    Initial labs drawn. Continue prenatal vitamins. Genetic Screening discussed, NIPS: ordered. Ultrasound discussed; fetal anatomic survey: ordered. Problem list reviewed and updated. The nature of [facility] with multiple MDs and other Advanced Practice Providers was explained to patient; also emphasized that residents, students are part of our team. Routine obstetric precautions reviewed. Return in about 1 month (around 07/09/2017) for ROB.     Orvilla Cornwallachelle Denney, CNM [facility], [facility]

## 2017-06-11 NOTE — Progress Notes (Signed)
Presents for NOB visit.  Tubal papers signed 06/11/17. Declined Flu Vaccine.

## 2017-06-12 LAB — OBSTETRIC PANEL, INCLUDING HIV
ANTIBODY SCREEN: NEGATIVE
Basophils Absolute: 0 10*3/uL (ref 0.0–0.2)
Basos: 0 %
EOS (ABSOLUTE): 0.4 10*3/uL (ref 0.0–0.4)
EOS: 2 %
HEMATOCRIT: 37.6 % (ref 34.0–46.6)
HEMOGLOBIN: 12.8 g/dL (ref 11.1–15.9)
HEP B S AG: NEGATIVE
HIV Screen 4th Generation wRfx: NONREACTIVE
IMMATURE GRANS (ABS): 0.1 10*3/uL (ref 0.0–0.1)
Immature Granulocytes: 1 %
LYMPHS: 24 %
Lymphocytes Absolute: 4.8 10*3/uL — ABNORMAL HIGH (ref 0.7–3.1)
MCH: 29.9 pg (ref 26.6–33.0)
MCHC: 34 g/dL (ref 31.5–35.7)
MCV: 88 fL (ref 79–97)
MONOS ABS: 1.6 10*3/uL — AB (ref 0.1–0.9)
Monocytes: 8 %
Neutrophils Absolute: 12.8 10*3/uL — ABNORMAL HIGH (ref 1.4–7.0)
Neutrophils: 65 %
Platelets: 369 10*3/uL (ref 150–379)
RBC: 4.28 x10E6/uL (ref 3.77–5.28)
RDW: 14.7 % (ref 12.3–15.4)
RH TYPE: POSITIVE
RPR: NONREACTIVE
Rubella Antibodies, IGG: 3.1 index (ref >0.99)
WBC: 19.7 10*3/uL — ABNORMAL HIGH (ref 3.4–10.8)

## 2017-06-12 LAB — CERVICOVAGINAL ANCILLARY ONLY
Bacterial vaginitis: POSITIVE — AB
CHLAMYDIA, DNA PROBE: NEGATIVE
Candida vaginitis: NEGATIVE
NEISSERIA GONORRHEA: NEGATIVE
Trichomonas: NEGATIVE

## 2017-06-12 LAB — VITAMIN D 25 HYDROXY (VIT D DEFICIENCY, FRACTURES): Vit D, 25-Hydroxy: 17.4 ng/mL — ABNORMAL LOW (ref 30.0–100.0)

## 2017-06-12 LAB — HEMOGLOBIN A1C
Est. average glucose Bld gHb Est-mCnc: 108 mg/dL
HEMOGLOBIN A1C: 5.4 % (ref 4.8–5.6)

## 2017-06-13 ENCOUNTER — Encounter: Payer: Self-pay | Admitting: Obstetrics & Gynecology

## 2017-06-13 LAB — CULTURE, OB URINE

## 2017-06-13 LAB — URINE CULTURE, OB REFLEX

## 2017-06-17 ENCOUNTER — Other Ambulatory Visit: Payer: Self-pay | Admitting: Certified Nurse Midwife

## 2017-06-17 DIAGNOSIS — Z3480 Encounter for supervision of other normal pregnancy, unspecified trimester: Secondary | ICD-10-CM

## 2017-06-17 DIAGNOSIS — E559 Vitamin D deficiency, unspecified: Secondary | ICD-10-CM | POA: Insufficient documentation

## 2017-06-17 DIAGNOSIS — B9689 Other specified bacterial agents as the cause of diseases classified elsewhere: Secondary | ICD-10-CM

## 2017-06-17 DIAGNOSIS — N760 Acute vaginitis: Principal | ICD-10-CM

## 2017-06-17 MED ORDER — VITAMIN D (ERGOCALCIFEROL) 1.25 MG (50000 UNIT) PO CAPS: 50000.0000 [IU] | ORAL_CAPSULE | ORAL | 12 refills | Status: AC

## 2017-06-17 MED ORDER — METRONIDAZOLE 0.75 % VA GEL
1.0000 | Freq: Two times a day (BID) | VAGINAL | 0 refills | Status: DC
Start: 2017-06-17 — End: 2017-08-06

## 2017-06-18 ENCOUNTER — Other Ambulatory Visit: Payer: Self-pay | Admitting: Certified Nurse Midwife

## 2017-06-18 DIAGNOSIS — Z3480 Encounter for supervision of other normal pregnancy, unspecified trimester: Secondary | ICD-10-CM

## 2017-07-09 ENCOUNTER — Ambulatory Visit (INDEPENDENT_AMBULATORY_CARE_PROVIDER_SITE_OTHER): Payer: BLUE CROSS/BLUE SHIELD | Admitting: Certified Nurse Midwife

## 2017-07-09 ENCOUNTER — Encounter: Payer: Self-pay | Admitting: Certified Nurse Midwife

## 2017-07-09 VITALS — BP 112/72 | HR 87 | Wt 179.6 lb

## 2017-07-09 DIAGNOSIS — Z98891 History of uterine scar from previous surgery: Secondary | ICD-10-CM

## 2017-07-09 DIAGNOSIS — E559 Vitamin D deficiency, unspecified: Secondary | ICD-10-CM

## 2017-07-09 DIAGNOSIS — Z3482 Encounter for supervision of other normal pregnancy, second trimester: Secondary | ICD-10-CM

## 2017-07-09 DIAGNOSIS — Z3480 Encounter for supervision of other normal pregnancy, unspecified trimester: Secondary | ICD-10-CM

## 2017-07-09 NOTE — Progress Notes (Signed)
   PRENATAL VISIT NOTE  Subjective:  Emma FillerMecaala J Carlson is a 32 y.o. [id number] at 8583w3d being seen today for ongoing prenatal care.  She is currently monitored for the following issues for this low-risk pregnancy and has History of cesarean delivery, antepartum; History of VBAC; Family history of breast cancer in mother; Chronic constipation; Family history- stomach cancer; Family history of colon cancer in mother; Supervision of other normal pregnancy, antepartum; and Vitamin D deficiency on their problem list.  Patient reports no complaints.  Contractions: Not present. Vag. Bleeding: None.   . Denies leaking of fluid.   The following portions of the patient's history were reviewed and updated as appropriate: allergies, current medications, past family history, past medical history, past social history, past surgical history and problem list. Problem list updated.  Objective:   Vitals:   07/09/17 0813  BP: 112/72  Pulse: 87  Weight: 179 lb 9.6 oz (81.5 kg)    Fetal Status: Fetal Heart Rate (bpm): 147; doppler         General:  Alert, oriented and cooperative. Patient is in no acute distress.  Skin: Skin is warm and dry. No rash noted.   Cardiovascular: Normal heart rate noted  Respiratory: Normal respiratory effort, no problems with respiration noted  Abdomen: Soft, gravid, appropriate for gestational age.  Pain/Pressure: Absent     Pelvic: Cervical exam deferred        Extremities: Normal range of motion.  Edema: None  Mental Status: Normal mood and affect. Normal behavior. Normal judgment and thought content.   Assessment and Plan:  Pregnancy: [id number] at 1583w3d  1. Supervision of other normal pregnancy, antepartum     Doing well  2. Vitamin D deficiency     Taking weekly vitamin D  3. History of VBAC     TOLAC planned.   Preterm labor symptoms and general obstetric precautions including but not limited to vaginal bleeding, contractions, leaking of fluid and fetal movement were  reviewed in detail with the patient. Please refer to After Visit Summary for other counseling recommendations.  Return in about 1 month (around 08/06/2017) for ROB.  No future appointments.  Roe Coombsachelle A Ayoub Arey, CNM

## 2017-07-09 NOTE — Progress Notes (Signed)
Patient is in the office for rob, no complaints.

## 2017-08-06 ENCOUNTER — Ambulatory Visit (INDEPENDENT_AMBULATORY_CARE_PROVIDER_SITE_OTHER): Payer: BLUE CROSS/BLUE SHIELD | Admitting: Certified Nurse Midwife

## 2017-08-06 VITALS — BP 133/86 | HR 99 | Wt 181.0 lb

## 2017-08-06 DIAGNOSIS — Z3480 Encounter for supervision of other normal pregnancy, unspecified trimester: Secondary | ICD-10-CM

## 2017-08-06 DIAGNOSIS — O219 Vomiting of pregnancy, unspecified: Secondary | ICD-10-CM

## 2017-08-06 DIAGNOSIS — Z98891 History of uterine scar from previous surgery: Secondary | ICD-10-CM

## 2017-08-06 DIAGNOSIS — Z3482 Encounter for supervision of other normal pregnancy, second trimester: Secondary | ICD-10-CM

## 2017-08-06 DIAGNOSIS — O34219 Maternal care for unspecified type scar from previous cesarean delivery: Secondary | ICD-10-CM

## 2017-08-06 DIAGNOSIS — E559 Vitamin D deficiency, unspecified: Secondary | ICD-10-CM

## 2017-08-06 MED ORDER — DOXYLAMINE-PYRIDOXINE ER 20-20 MG PO TBCR: 1.0000 | EXTENDED_RELEASE_TABLET | Freq: Two times a day (BID) | ORAL | 6 refills | Status: DC

## 2017-08-06 MED ORDER — ONDANSETRON 8 MG PO TBDP: 8.0000 mg | ORAL_TABLET | Freq: Three times a day (TID) | ORAL | 4 refills | Status: DC | PRN

## 2017-08-06 MED ORDER — DOXYLAMINE-PYRIDOXINE 10-10 MG PO TBEC: DELAYED_RELEASE_TABLET | ORAL | 4 refills | Status: DC

## 2017-08-06 NOTE — Progress Notes (Signed)
   PRENATAL VISIT NOTE  Subjective:  Emma Carlson is a 32 y.o. [id number] at [redacted]w[redacted]d being seen today for ongoing prenatal care.  She is currently monitored for the following issues for this low-risk pregnancy and has History of cesarean delivery, antepartum; History of VBAC; Family history of breast cancer in mother; Chronic constipation; Family history- stomach cancer; Family history of colon cancer in mother; Supervision of other normal pregnancy, antepartum; and Vitamin D deficiency on their problem list.  Patient reports nausea, no bleeding, no contractions, no cramping, no leaking and vomiting.  Contractions: Not present. Vag. Bleeding: None.   . Denies leaking of fluid.   The following portions of the patient's history were reviewed and updated as appropriate: allergies, current medications, past family history, past medical history, past social history, past surgical history and problem list. Problem list updated.  Objective:   Vitals:   08/06/17 0819  BP: 133/86  Pulse: 99  Weight: 181 lb (82.1 kg)    Fetal Status: Fetal Heart Rate (bpm): 145; doppler         General:  Alert, oriented and cooperative. Patient is in no acute distress.  Skin: Skin is warm and dry. No rash noted.   Cardiovascular: Normal heart rate noted  Respiratory: Normal respiratory effort, no problems with respiration noted  Abdomen: Soft, gravid, appropriate for gestational age.  Pain/Pressure: Absent     Pelvic: Cervical exam deferred        Extremities: Normal range of motion.     Mental Status: Normal mood and affect. Normal behavior. Normal judgment and thought content.   Assessment and Plan:  Pregnancy: A5W0981 at [redacted]w[redacted]d  1. Supervision of other normal pregnancy, antepartum      - AFP, Serum, Open Spina Bifida - [place] MFM OB COMP + 14 WK; Future  2. Vitamin D deficiency    Taking weekly vitamin D  3. History of VBAC     Last pregnancy, repeat VBAC planned  4. Nausea/vomiting in pregnancy        - ondansetron (ZOFRAN ODT) 8 MG disintegrating tablet; Take 1 tablet (8 mg total) by mouth every 8 (eight) hours as needed for nausea or vomiting.  Dispense: 20 tablet; Refill: 4 - Doxylamine-Pyridoxine ER (BONJESTA) 20-20 MG TBCR; Take 1 tablet by mouth 2 (two) times daily.  Dispense: 60 tablet; Refill: 6  Preterm labor symptoms and general obstetric precautions including but not limited to vaginal bleeding, contractions, leaking of fluid and fetal movement were reviewed in detail with the patient. Please refer to After Visit Summary for other counseling recommendations.  Return in about 1 month (around 09/03/2017) for ROB.  No future appointments.  Roe Coombs, CNM

## 2017-08-12 ENCOUNTER — Encounter (HOSPITAL_COMMUNITY): Payer: Self-pay

## 2017-08-12 LAB — AFP, SERUM, OPEN SPINA BIFIDA
AFP MOM: 2.29
AFP Value: 87.3 ng/mL
Gest. Age on Collection Date: 17.4 weeks
MATERNAL AGE AT EDD: 33.9 a
OSBR Risk 1 IN: 870
TEST RESULTS AFP: NEGATIVE
Weight: 181 [lb_av]

## 2017-08-13 ENCOUNTER — Other Ambulatory Visit: Payer: Self-pay | Admitting: Certified Nurse Midwife

## 2017-08-13 DIAGNOSIS — Z3480 Encounter for supervision of other normal pregnancy, unspecified trimester: Secondary | ICD-10-CM

## 2017-08-20 ENCOUNTER — Other Ambulatory Visit: Payer: Self-pay | Admitting: Certified Nurse Midwife

## 2017-08-20 ENCOUNTER — Ambulatory Visit (HOSPITAL_COMMUNITY)
Admission: RE | Admit: 2017-08-20 | Discharge: 2017-08-20 | Disposition: A | Discharge status: Home / Self Care | Payer: BLUE CROSS/BLUE SHIELD | Source: Ambulatory Visit | Attending: Certified Nurse Midwife | Admitting: Certified Nurse Midwife

## 2017-08-20 DIAGNOSIS — Z363 Encounter for antenatal screening for malformations: Secondary | ICD-10-CM

## 2017-08-20 DIAGNOSIS — Z3A19 19 weeks gestation of pregnancy: Secondary | ICD-10-CM

## 2017-08-20 DIAGNOSIS — Z3480 Encounter for supervision of other normal pregnancy, unspecified trimester: Secondary | ICD-10-CM

## 2017-08-20 DIAGNOSIS — Z3482 Encounter for supervision of other normal pregnancy, second trimester: Secondary | ICD-10-CM | POA: Diagnosis not present

## 2017-08-20 DIAGNOSIS — O34219 Maternal care for unspecified type scar from previous cesarean delivery: Secondary | ICD-10-CM | POA: Diagnosis not present

## 2017-08-21 ENCOUNTER — Other Ambulatory Visit: Payer: Self-pay | Admitting: Certified Nurse Midwife

## 2017-08-21 DIAGNOSIS — Z3480 Encounter for supervision of other normal pregnancy, unspecified trimester: Secondary | ICD-10-CM

## 2017-09-03 ENCOUNTER — Ambulatory Visit (INDEPENDENT_AMBULATORY_CARE_PROVIDER_SITE_OTHER): Payer: BLUE CROSS/BLUE SHIELD | Admitting: Certified Nurse Midwife

## 2017-09-03 ENCOUNTER — Encounter: Payer: Self-pay | Admitting: Certified Nurse Midwife

## 2017-09-03 VITALS — BP 119/77 | HR 106 | Wt 188.0 lb

## 2017-09-03 DIAGNOSIS — E559 Vitamin D deficiency, unspecified: Secondary | ICD-10-CM

## 2017-09-03 DIAGNOSIS — O34219 Maternal care for unspecified type scar from previous cesarean delivery: Secondary | ICD-10-CM

## 2017-09-03 DIAGNOSIS — Z3482 Encounter for supervision of other normal pregnancy, second trimester: Secondary | ICD-10-CM

## 2017-09-03 DIAGNOSIS — Z3480 Encounter for supervision of other normal pregnancy, unspecified trimester: Secondary | ICD-10-CM

## 2017-09-03 DIAGNOSIS — O99012 Anemia complicating pregnancy, second trimester: Secondary | ICD-10-CM

## 2017-09-03 MED ORDER — CYCLOBENZAPRINE HCL 10 MG PO TABS: 10.0000 mg | ORAL_TABLET | Freq: Three times a day (TID) | ORAL | 1 refills | Status: AC | PRN

## 2017-09-03 MED ORDER — CITRANATAL BLOOM 90-1 MG PO TABS: 1.0000 | ORAL_TABLET | Freq: Every day | ORAL | 6 refills | Status: AC

## 2017-09-03 NOTE — Progress Notes (Signed)
   PRENATAL VISIT NOTE  Subjective:  Emma Carlson is a 32 y.o. [id number] at 6926w3d being seen today for ongoing prenatal care.  She is currently monitored for the following issues for this low-risk pregnancy and has History of cesarean delivery, antepartum; History of VBAC; Family history of breast cancer in mother; Chronic constipation; Family history- stomach cancer; Family history of colon cancer in mother; Supervision of other normal pregnancy, antepartum; and Vitamin D deficiency on their problem list.  Patient reports no bleeding, no contractions, no cramping, no leaking and Edinburgh depression screening: 8, needs dental treatment with extractions.  Dental letter sent to her dentist..  Contractions: Not present. Vag. Bleeding: None.  Movement: Present. Denies leaking of fluid.   The following portions of the patient's history were reviewed and updated as appropriate: allergies, current medications, past family history, past medical history, past social history, past surgical history and problem list. Problem list updated.  Objective:   Vitals:   09/03/17 0833  BP: 119/77  Pulse: (!) 106  Weight: 188 lb (85.3 kg)    Fetal Status: Fetal Heart Rate (bpm): 157; doppler Fundal Height: 21 cm Movement: Present     General:  Alert, oriented and cooperative. Patient is in no acute distress.  Skin: Skin is warm and dry. No rash noted.   Cardiovascular: Normal heart rate noted  Respiratory: Normal respiratory effort, no problems with respiration noted  Abdomen: Soft, gravid, appropriate for gestational age.  Pain/Pressure: Absent     Pelvic: Cervical exam deferred        Extremities: Normal range of motion.     Mental Status: Normal mood and affect. Normal behavior. Normal judgment and thought content.   Assessment and Plan:  Pregnancy: [id number] at 1426w3d  1. Supervision of other normal pregnancy, antepartum     Doing well overall.  Needs dental treatment, letter sent to her dentist.  F/U  anatomy US scheduled. - cyclobenzaprine (FLEXERIL) 10 MG tablet; Take 1 tablet (10 mg total) by mouth 3 (three) times daily as needed for muscle spasms.  Dispense: 30 tablet; Refill: 1  2. Vitamin D deficiency     Taking weekly vitamin D  3. History of cesarean delivery, antepartum     VBAC, previous VBAC  4. Anemia during pregnancy in second trimester      - Prenatal-DSS-FeCb-FeGl-FA (CITRANATAL BLOOM) 90-1 MG TABS; Take 1 tablet by mouth daily.  Dispense: 30 tablet; Refill: 6  Preterm labor symptoms and general obstetric precautions including but not limited to vaginal bleeding, contractions, leaking of fluid and fetal movement were reviewed in detail with the patient. Please refer to After Visit Summary for other counseling recommendations.  Return in about 1 month (around 10/01/2017) for ROB.  Future Appointments  Date Time Provider Department Center  09/22/2017  9:00 AM [facility] US 3 [facility]    Roe Coombsachelle A Beuna Bolding, CNM

## 2017-09-22 ENCOUNTER — Ambulatory Visit (HOSPITAL_COMMUNITY)
Admission: RE | Admit: 2017-09-22 | Discharge: 2017-09-22 | Disposition: A | Discharge status: Home / Self Care | Payer: BLUE CROSS/BLUE SHIELD | Source: Ambulatory Visit | Attending: Certified Nurse Midwife | Admitting: Certified Nurse Midwife

## 2017-09-22 ENCOUNTER — Other Ambulatory Visit: Payer: Self-pay | Admitting: Certified Nurse Midwife

## 2017-09-22 DIAGNOSIS — O34219 Maternal care for unspecified type scar from previous cesarean delivery: Secondary | ICD-10-CM

## 2017-09-22 DIAGNOSIS — Z98891 History of uterine scar from previous surgery: Secondary | ICD-10-CM | POA: Diagnosis not present

## 2017-09-22 DIAGNOSIS — Z362 Encounter for other antenatal screening follow-up: Secondary | ICD-10-CM

## 2017-09-22 DIAGNOSIS — Z3482 Encounter for supervision of other normal pregnancy, second trimester: Secondary | ICD-10-CM | POA: Insufficient documentation

## 2017-09-22 DIAGNOSIS — Z3A24 24 weeks gestation of pregnancy: Secondary | ICD-10-CM | POA: Diagnosis not present

## 2017-09-22 DIAGNOSIS — Z3480 Encounter for supervision of other normal pregnancy, unspecified trimester: Secondary | ICD-10-CM

## 2017-09-24 ENCOUNTER — Other Ambulatory Visit: Payer: Self-pay | Admitting: Certified Nurse Midwife

## 2017-09-24 DIAGNOSIS — Z3480 Encounter for supervision of other normal pregnancy, unspecified trimester: Secondary | ICD-10-CM

## 2017-10-01 ENCOUNTER — Encounter: Payer: Self-pay | Admitting: Obstetrics and Gynecology

## 2017-10-01 ENCOUNTER — Ambulatory Visit (INDEPENDENT_AMBULATORY_CARE_PROVIDER_SITE_OTHER): Payer: BLUE CROSS/BLUE SHIELD | Admitting: Obstetrics and Gynecology

## 2017-10-01 VITALS — BP 130/79 | HR 121 | Wt 184.0 lb

## 2017-10-01 DIAGNOSIS — O34219 Maternal care for unspecified type scar from previous cesarean delivery: Secondary | ICD-10-CM

## 2017-10-01 DIAGNOSIS — Z3480 Encounter for supervision of other normal pregnancy, unspecified trimester: Secondary | ICD-10-CM

## 2017-10-01 DIAGNOSIS — Z98891 History of uterine scar from previous surgery: Secondary | ICD-10-CM

## 2017-10-01 NOTE — Progress Notes (Signed)
   PRENATAL VISIT NOTE  Subjective:  Emma Carlson is a 32 y.o. [id number] at 4173w3d being seen today for ongoing prenatal care.  She is currently monitored for the following issues for this high-risk pregnancy and has History of cesarean delivery, antepartum; History of VBAC; Family history of breast cancer in mother; Chronic constipation; Family history- stomach cancer; Family history of colon cancer in mother; Supervision of other normal pregnancy, antepartum; and Vitamin D deficiency on their problem list.  Patient reports no complaints.  Contractions: Not present. Vag. Bleeding: None.  Movement: Present. Denies leaking of fluid.   The following portions of the patient's history were reviewed and updated as appropriate: allergies, current medications, past family history, past medical history, past social history, past surgical history and problem list. Problem list updated.  Objective:   Vitals:   10/01/17 0909  BP: 130/79  Pulse: (!) 121  Weight: 184 lb (83.5 kg)    Fetal Status: Fetal Heart Rate (bpm): 145 Fundal Height: 25 cm Movement: Present     General:  Alert, oriented and cooperative. Patient is in no acute distress.  Skin: Skin is warm and dry. No rash noted.   Cardiovascular: Normal heart rate noted  Respiratory: Normal respiratory effort, no problems with respiration noted  Abdomen: Soft, gravid, appropriate for gestational age.  Pain/Pressure: Absent     Pelvic: Cervical exam deferred        Extremities: Normal range of motion.     Mental Status: Normal mood and affect. Normal behavior. Normal judgment and thought content.   Assessment and Plan:  Pregnancy: [id number] at 7673w3d  1. Supervision of other normal pregnancy, antepartum Reviewed contraception, partner will likely get vasectomy, reviewed that she will need to sign papers if she wants BTL  2. History of cesarean delivery, antepartum For TOLAC, conset previously signed  3. History of VBAC  Preterm labor  symptoms and general obstetric precautions including but not limited to vaginal bleeding, contractions, leaking of fluid and fetal movement were reviewed in detail with the patient. Please refer to After Visit Summary for other counseling recommendations.  Return in about 2 days (around 10/03/2017) for OB visit (MD), 2 hr GTT, 3rd trim labs.  Future Appointments  Date Time Provider Department Center  10/15/2017  8:15 AM [facility] None  10/15/2017  8:45 AM Constant, Gigi GinPeggy, MD [facility] None    Conan BowensKelly M Davis, MD

## 2017-10-01 NOTE — Patient Instructions (Signed)

## 2017-10-15 ENCOUNTER — Encounter: Payer: Self-pay | Admitting: Obstetrics and Gynecology

## 2017-10-15 ENCOUNTER — Other Ambulatory Visit: Payer: BLUE CROSS/BLUE SHIELD

## 2017-10-15 ENCOUNTER — Ambulatory Visit (INDEPENDENT_AMBULATORY_CARE_PROVIDER_SITE_OTHER): Payer: BLUE CROSS/BLUE SHIELD | Admitting: Obstetrics and Gynecology

## 2017-10-15 VITALS — BP 123/85 | HR 102 | Wt 186.0 lb

## 2017-10-15 DIAGNOSIS — Z3480 Encounter for supervision of other normal pregnancy, unspecified trimester: Secondary | ICD-10-CM | POA: Diagnosis not present

## 2017-10-15 DIAGNOSIS — Z3483 Encounter for supervision of other normal pregnancy, third trimester: Secondary | ICD-10-CM

## 2017-10-15 DIAGNOSIS — O34219 Maternal care for unspecified type scar from previous cesarean delivery: Secondary | ICD-10-CM

## 2017-10-15 NOTE — Progress Notes (Signed)
Declines Tdap today.  Pt has no concerns today.

## 2017-10-15 NOTE — Progress Notes (Signed)
   PRENATAL VISIT NOTE  Subjective:  Emma Carlson is a 32 y.o. [id number] at 545w3d being seen today for ongoing prenatal care.  She is currently monitored for the following issues for this low-risk pregnancy and has History of cesarean delivery, antepartum; History of VBAC; Family history of breast cancer in mother; Chronic constipation; Family history- stomach cancer; Family history of colon cancer in mother; Supervision of other normal pregnancy, antepartum; and Vitamin D deficiency on their problem list.  Patient reports no complaints.  Contractions: Not present. Vag. Bleeding: None.  Movement: Present. Denies leaking of fluid.   The following portions of the patient's history were reviewed and updated as appropriate: allergies, current medications, past family history, past medical history, past social history, past surgical history and problem list. Problem list updated.  Objective:   Vitals:   10/15/17 0824  BP: 123/85  Pulse: (!) 102  Weight: 186 lb (84.4 kg)    Fetal Status: Fetal Heart Rate (bpm): 144 Fundal Height: 28 cm Movement: Present     General:  Alert, oriented and cooperative. Patient is in no acute distress.  Skin: Skin is warm and dry. No rash noted.   Cardiovascular: Normal heart rate noted  Respiratory: Normal respiratory effort, no problems with respiration noted  Abdomen: Soft, gravid, appropriate for gestational age.  Pain/Pressure: Absent     Pelvic: Cervical exam deferred        Extremities: Normal range of motion.  Edema: Trace  Mental Status: Normal mood and affect. Normal behavior. Normal judgment and thought content.   Assessment and Plan:  Pregnancy: [id number] at 1045w3d  1. Supervision of other normal pregnancy, antepartum Patient is doing well Third trimester labs, glucola today Patient declined tdap - Glucose Tolerance, 2 Hours w/1 Hour  2. History of cesarean delivery, antepartum Desires TOLAC Plans vasectomy  Preterm labor symptoms and  general obstetric precautions including but not limited to vaginal bleeding, contractions, leaking of fluid and fetal movement were reviewed in detail with the patient. Please refer to After Visit Summary for other counseling recommendations.  Return in about 2 weeks (around 10/29/2017) for ROB.  Future Appointments  Date Time Provider Department Center  10/15/2017  8:45 AM Cailee Blanke, Gigi GinPeggy, MD [facility] None    Catalina AntiguaPeggy Quintana Canelo, MD

## 2017-10-16 LAB — GLUCOSE TOLERANCE, 2 HOURS W/ 1HR
GLUCOSE, FASTING: 74 mg/dL (ref 65–91)
Glucose, 1 hour: 153 mg/dL (ref 65–179)
Glucose, 2 hour: 115 mg/dL (ref 65–152)

## 2017-10-16 LAB — CBC
HEMATOCRIT: 29.5 % — AB (ref 34.0–46.6)
HEMOGLOBIN: 9.9 g/dL — AB (ref 11.1–15.9)
MCH: 29.6 pg (ref 26.6–33.0)
MCHC: 33.6 g/dL (ref 31.5–35.7)
MCV: 88 fL (ref 79–97)
PLATELETS: 311 10*3/uL (ref 150–450)
RBC: 3.35 x10E6/uL — AB (ref 3.77–5.28)
RDW: 14.1 % (ref 12.3–15.4)
WBC: 19.3 10*3/uL — ABNORMAL HIGH (ref 3.4–10.8)

## 2017-10-16 LAB — RPR: RPR Ser Ql: NONREACTIVE

## 2017-10-16 LAB — HIV ANTIBODY (ROUTINE TESTING W REFLEX): HIV Screen 4th Generation wRfx: NONREACTIVE

## 2017-10-28 ENCOUNTER — Ambulatory Visit (INDEPENDENT_AMBULATORY_CARE_PROVIDER_SITE_OTHER): Payer: BLUE CROSS/BLUE SHIELD | Admitting: Obstetrics and Gynecology

## 2017-10-28 DIAGNOSIS — O09893 Supervision of other high risk pregnancies, third trimester: Secondary | ICD-10-CM

## 2017-10-28 DIAGNOSIS — Z98891 History of uterine scar from previous surgery: Secondary | ICD-10-CM

## 2017-10-28 NOTE — Progress Notes (Signed)
Prenatal Visit Note Date: 10/28/2017 Clinic: [facility]  Subjective:  Emma Carlson is a 32 y.o. [id number] at 1876w2d being seen today for ongoing prenatal care.  She is currently monitored for the following issues for this low-risk pregnancy and has History of VBAC; Family history of breast cancer in mother; Chronic constipation; Family history- stomach cancer; Family history of colon cancer in mother; Supervision of other normal pregnancy, antepartum; Vitamin D deficiency; and Short interval between pregnancies affecting pregnancy in third trimester, antepartum on their problem list.  Patient reports no complaints.   Contractions: Not present. Vag. Bleeding: None.  Movement: Present. Denies leaking of fluid.   The following portions of the patient's history were reviewed and updated as appropriate: allergies, current medications, past family history, past medical history, past social history, past surgical history and problem list. Problem list updated.  Objective:   Vitals:   10/28/17 0845  BP: 126/82  Pulse: (!) 109  Weight: 192 lb (87.1 kg)    Fetal Status: Fetal Heart Rate (bpm): 155 Fundal Height: 30 cm Movement: Present     General:  Alert, oriented and cooperative. Patient is in no acute distress.  Skin: Skin is warm and dry. No rash noted.   Cardiovascular: Normal heart rate noted  Respiratory: Normal respiratory effort, no problems with respiration noted  Abdomen: Soft, gravid, appropriate for gestational age. Pain/Pressure: Absent     Pelvic:  Cervical exam deferred        Extremities: Normal range of motion.     Mental Status: Normal mood and affect. Normal behavior. Normal judgment and thought content.   Urinalysis:      Assessment and Plan:  Pregnancy: [id number] at 1576w2d  1. History of VBAC 2013. 7lbs 6oz. Pt states she got to pushing but [place] never told she couldn't labor. Desires rpt vbac, form already  signed.   2. Pregnancy Vasectomy. Doesn't want btl. D/w her that her papers expire in mid sept so if wanted btl would have to re-sign. Recommend taking vitamin c with PO iron   Preterm labor symptoms and general obstetric precautions including but not limited to vaginal bleeding, contractions, leaking of fluid and fetal movement were reviewed in detail with the patient. Please refer to After Visit Summary for other counseling recommendations.  Return in about 2 weeks (around 11/11/2017) for 2-3wk rob.   [place] BingPickens, Ludwig Tugwell, MD

## 2017-11-11 ENCOUNTER — Encounter: Payer: BLUE CROSS/BLUE SHIELD | Admitting: Obstetrics and Gynecology

## 2017-11-11 ENCOUNTER — Ambulatory Visit (INDEPENDENT_AMBULATORY_CARE_PROVIDER_SITE_OTHER): Payer: BLUE CROSS/BLUE SHIELD | Admitting: Medical

## 2017-11-11 ENCOUNTER — Encounter: Payer: Self-pay | Admitting: Medical

## 2017-11-11 VITALS — BP 131/86 | HR 108 | Wt 188.6 lb

## 2017-11-11 DIAGNOSIS — Z3480 Encounter for supervision of other normal pregnancy, unspecified trimester: Secondary | ICD-10-CM

## 2017-11-11 DIAGNOSIS — Z98891 History of uterine scar from previous surgery: Secondary | ICD-10-CM

## 2017-11-11 DIAGNOSIS — E559 Vitamin D deficiency, unspecified: Secondary | ICD-10-CM

## 2017-11-11 NOTE — Progress Notes (Signed)
   PRENATAL VISIT NOTE  Subjective:  Emma Carlson is a 32 y.o. [id number] at 3675w2d being seen today for ongoing prenatal care.  She is currently monitored for the following issues for this low-risk pregnancy and has History of VBAC; Family history of breast cancer in mother; Chronic constipation; Family history- stomach cancer; Family history of colon cancer in mother; Supervision of other normal pregnancy, antepartum; Vitamin D deficiency; and Short interval between pregnancies affecting pregnancy in third trimester, antepartum on their problem list.  Patient reports no complaints.  Contractions: Irregular. Vag. Bleeding: None.  Movement: Present. Denies leaking of fluid.   The following portions of the patient's history were reviewed and updated as appropriate: allergies, current medications, past family history, past medical history, past social history, past surgical history and problem list. Problem list updated.  Objective:   Vitals:   11/11/17 0856  BP: 131/86  Pulse: (!) 108  Weight: 188 lb 9.6 oz (85.5 kg)    Fetal Status: Fetal Heart Rate (bpm): 141 Fundal Height: 33 cm Movement: Present     General:  Alert, oriented and cooperative. Patient is in no acute distress.  Skin: Skin is warm and dry. No rash noted.   Cardiovascular: Normal heart rate noted  Respiratory: Normal respiratory effort, no problems with respiration noted  Abdomen: Soft, gravid, appropriate for gestational age.  Pain/Pressure: Present     Pelvic: Cervical exam deferred        Extremities: Normal range of motion.  Edema: Trace  Mental Status: Normal mood and affect. Normal behavior. Normal judgment and thought content.   Assessment and Plan:  Pregnancy: [id number] at 7075w2d  1. Supervision of other normal pregnancy, antepartum - Doing well, some round ligament pain - Few BH contractions daily (< 5)   2. Vitamin D deficiency  3. History of VBAC - Planning VBAC again with this pregnancy   Preterm labor  symptoms and general obstetric precautions including but not limited to vaginal bleeding, contractions, leaking of fluid and fetal movement were reviewed in detail with the patient. Please refer to After Visit Summary for other counseling recommendations.  Return in about 2 weeks (around 11/25/2017) for LOB.   Vonzella NippleJulie Wenzel, PA-C

## 2017-11-11 NOTE — Patient Instructions (Signed)
Research childbirth classes and hospital preregistration at [email or web address]  Fetal Movement Counts Patient Name: ________________________________________________ Patient Due Date: ____________________ What is a fetal movement count? A fetal movement count is the number of times that you feel your baby move during a certain amount of time. This may also be called a fetal kick count. A fetal movement count is recommended for every pregnant woman. You may be asked to start counting fetal movements as early as week 28 of your pregnancy. Pay attention to when your baby is most active. You may notice your baby's sleep and wake cycles. You may also notice things that make your baby move more. You should do a fetal movement count:  When your baby is normally most active.  At the same time each day.  A good time to count movements is while you are resting, after having something to eat and drink. How do I count fetal movements? 1. Find a quiet, comfortable area. Sit, or lie down on your side. 2. Write down the date, the start time and stop time, and the number of movements that you felt between those two times. Take this information with you to your health care visits. 3. For 2 hours, count kicks, flutters, swishes, rolls, and jabs. You should feel at least 10 movements during 2 hours. 4. You may stop counting after you have felt 10 movements. 5. If you do not feel 10 movements in 2 hours, have something to eat and drink. Then, keep resting and counting for 1 hour. If you feel at least 4 movements during that hour, you may stop counting. Contact a health care provider if:  You feel fewer than 4 movements in 2 hours.  Your baby is not moving like he or she usually does. Date: ____________ Start time: ____________ Stop time: ____________ Movements: ____________ Date: ____________ Start time: ____________ Stop time: ____________ Movements: ____________ Date: ____________ Start time: ____________  Stop time: ____________ Movements: ____________ Date: ____________ Start time: ____________ Stop time: ____________ Movements: ____________ Date: ____________ Start time: ____________ Stop time: ____________ Movements: ____________ Date: ____________ Start time: ____________ Stop time: ____________ Movements: ____________ Date: ____________ Start time: ____________ Stop time: ____________ Movements: ____________ Date: ____________ Start time: ____________ Stop time: ____________ Movements: ____________ Date: ____________ Start time: ____________ Stop time: ____________ Movements: ____________ This information is not intended to replace advice given to you by your health care provider. Make sure you discuss any questions you have with your health care provider. Document Released: 04/02/2006 Document Revised: 10/31/2015 Document Reviewed: 04/12/2015 Elsevier Interactive Patient Education  2018 [employer]  Braxton Hicks Contractions Contractions of the uterus can occur throughout pregnancy, but they are not always a sign that you are in labor. You may have practice contractions called Braxton Hicks contractions. These false labor contractions are sometimes confused with true labor. What are Braxton Hicks contractions? Braxton Hicks contractions are tightening movements that occur in the muscles of the uterus before labor. Unlike true labor contractions, these contractions do not result in opening (dilation) and thinning of the cervix. Toward the end of pregnancy (32-34 weeks), Braxton Hicks contractions can happen more often and may become stronger. These contractions are sometimes difficult to tell apart from true labor because they can be very uncomfortable. You should not feel embarrassed if you go to the hospital with false labor. Sometimes, the only way to tell if you are in true labor is for your health care provider to look for changes in the cervix. The health care provider will   do a physical  exam and may monitor your contractions. If you are not in true labor, the exam should show that your cervix is not dilating and your water has not broken. If there are other health problems associated with your pregnancy, it is completely safe for you to be sent home with false labor. You may continue to have Braxton Hicks contractions until you go into true labor. How to tell the difference between true labor and false labor True labor  Contractions last 30-70 seconds.  Contractions become very regular.  Discomfort is usually felt in the top of the uterus, and it spreads to the lower abdomen and low back.  Contractions do not go away with walking.  Contractions usually become more intense and increase in frequency.  The cervix dilates and gets thinner. False labor  Contractions are usually shorter and not as strong as true labor contractions.  Contractions are usually irregular.  Contractions are often felt in the front of the lower abdomen and in the groin.  Contractions may go away when you walk around or change positions while lying down.  Contractions get weaker and are shorter-lasting as time goes on.  The cervix usually does not dilate or become thin. Follow these instructions at home:  Take over-the-counter and prescription medicines only as told by your health care provider.  Keep up with your usual exercises and follow other instructions from your health care provider.  Eat and drink lightly if you think you are going into labor.  If Braxton Hicks contractions are making you uncomfortable: ? Change your position from lying down or resting to walking, or change from walking to resting. ? Sit and rest in a tub of warm water. ? Drink enough fluid to keep your urine pale yellow. Dehydration may cause these contractions. ? Do slow and deep breathing several times an hour.  Keep all follow-up prenatal visits as told by your health care provider. This is  important. Contact a health care provider if:  You have a fever.  You have continuous pain in your abdomen. Get help right away if:  Your contractions become stronger, more regular, and closer together.  You have fluid leaking or gushing from your vagina.  You pass blood-tinged mucus (bloody show).  You have bleeding from your vagina.  You have low back pain that you never had before.  You feel your baby's head pushing down and causing pelvic pressure.  Your baby is not moving inside you as much as it used to. Summary  Contractions that occur before labor are called Braxton Hicks contractions, false labor, or practice contractions.  Braxton Hicks contractions are usually shorter, weaker, farther apart, and less regular than true labor contractions. True labor contractions usually become progressively stronger and regular and they become more frequent.  Manage discomfort from Braxton Hicks contractions by changing position, resting in a warm bath, drinking plenty of water, or practicing deep breathing. This information is not intended to replace advice given to you by your health care provider. Make sure you discuss any questions you have with your health care provider. Document Released: 07/17/2016 Document Revised: 07/17/2016 Document Reviewed: 07/17/2016 Elsevier Interactive Patient Education  2018 [employer]    

## 2017-11-25 ENCOUNTER — Ambulatory Visit (INDEPENDENT_AMBULATORY_CARE_PROVIDER_SITE_OTHER): Payer: BLUE CROSS/BLUE SHIELD | Admitting: Obstetrics & Gynecology

## 2017-11-25 DIAGNOSIS — Z3480 Encounter for supervision of other normal pregnancy, unspecified trimester: Secondary | ICD-10-CM

## 2017-11-25 DIAGNOSIS — Z3483 Encounter for supervision of other normal pregnancy, third trimester: Secondary | ICD-10-CM

## 2017-11-25 NOTE — Progress Notes (Signed)
   PRENATAL VISIT NOTE  Subjective:  Emma Carlson is a 31 y.o. [id number] at [redacted]w[redacted]d being seen today for ongoing prenatal care.  She is currently monitored for the following issues for this low-risk pregnancy and has History of VBAC; Family history of breast cancer in mother; Chronic constipation; Family history- stomach cancer; Family history of colon cancer in mother; Supervision of other normal pregnancy, antepartum; Vitamin D deficiency; and Short interval between pregnancies affecting pregnancy in third trimester, antepartum on their problem list.  Patient reports no complaints.  Contractions: Irregular. Vag. Bleeding: None.  Movement: Present. Denies leaking of fluid.   The following portions of the patient's history were reviewed and updated as appropriate: allergies, current medications, past family history, past medical history, past social history, past surgical history and problem list. Problem list updated.  Objective:   Vitals:   11/25/17 1037  BP: 126/80  Pulse: 99  Weight: 190 lb 4.8 oz (86.3 kg)    Fetal Status: Fetal Heart Rate (bpm): 145 Fundal Height: 34 cm Movement: Present     General:  Alert, oriented and cooperative. Patient is in no acute distress.  Skin: Skin is warm and dry. No rash noted.   Cardiovascular: Normal heart rate noted  Respiratory: Normal respiratory effort, no problems with respiration noted  Abdomen: Soft, gravid, appropriate for gestational age.  Pain/Pressure: Absent     Pelvic: Cervical exam deferred        Extremities: Normal range of motion.  Edema: Trace  Mental Status: Normal mood and affect. Normal behavior. Normal judgment and thought content.   Assessment and Plan:  Pregnancy: [id number] at [redacted]w[redacted]d  1. Supervision of other normal pregnancy, antepartum Plans TOLAC  Preterm labor symptoms and general obstetric precautions including but not limited to vaginal bleeding, contractions, leaking of fluid and fetal movement were reviewed in  detail with the patient. Please refer to After Visit Summary for other counseling recommendations.  Return in about 2 weeks (around 12/09/2017).  No future appointments.  Scheryl Darter, MD

## 2017-11-25 NOTE — Progress Notes (Signed)
Patient reports good fetal movement with irregular contractions.  

## 2017-11-25 NOTE — Patient Instructions (Signed)
Vaginal Birth After Cesarean Delivery Vaginal birth after cesarean delivery (VBAC) is giving birth vaginally after previously delivering a baby by a cesarean. In the past, if a woman had a cesarean delivery, all births afterward would be done by cesarean delivery. This is no longer true. It can be safe for the mother to try a vaginal delivery after having a cesarean delivery. It is important to discuss VBAC with your health care provider early in the pregnancy so you can understand the risks, benefits, and options. It will give you time to decide what is best in your particular case. The final decision about whether to have a VBAC or repeat cesarean delivery should be between you and your health care provider. Any changes in your health or your baby's health during your pregnancy may make it necessary to change your initial decision about VBAC. Women who plan to have a VBAC should check with their health care provider to be sure that:  The previous cesarean delivery was done with a low transverse uterine cut (incision) (not a vertical classical incision).  The birth canal is big enough for the baby.  There were no other operations on the uterus.  An electronic fetal monitor (EFM) will be on at all times during labor.  An operating room will be available and ready in case an emergency cesarean delivery is needed.  A health care provider and surgical nursing staff will be available at all times during labor to be ready to do an emergency delivery cesarean if necessary.  An anesthesiologist will be present in case an emergency cesarean delivery is needed.  The nursery is prepared and has adequate personnel and necessary equipment available to care for the baby in case of an emergency cesarean delivery. Benefits of VBAC  Shorter stay in the hospital.  Avoidance of risks associated with cesarean delivery, such as: ? Surgical complications, such as opening of the incision or hernia in the  incision. ? Injury to other organs. ? Fever. This can occur if an infection develops after surgery. It can also occur as a reaction to the medicine given to make you numb during the surgery.  Less blood loss and need for blood transfusions.  Lower risk of blood clots and infection.  Shorter recovery.  Decreased risk for having to remove the uterus (hysterectomy).  Decreased risk for the placenta to completely or partially cover the opening of the uterus (placenta previa) with a future pregnancy.  Decrease risk in future labor and delivery. Risks of a VBAC  Tearing (rupture) of the uterus. This is occurs in less than 1% of VBACs. The risk of this happening is higher if: ? Steps are taken to begin the labor process (induce labor) or stimulate or strengthen contractions (augment labor). ? Medicine is used to soften (ripen) the cervix.  Having to remove the uterus (hysterectomy) if it ruptures. VBAC should not be done if:  The previous cesarean delivery was done with a vertical (classical) or T-shaped incision or you do not know what kind of incision was made.  You had a ruptured uterus.  You have had certain types of surgery on your uterus, such as removal of uterine fibroids. Ask your health care provider about other types of surgeries that prevent you from having a VBAC.  You have certain medical or childbirth (obstetrical) problems.  There are problems with the baby.  You have had two previous cesarean deliveries and no vaginal deliveries. Other facts to know about VBAC:  It   is safe to have an epidural anesthetic with VBAC.  It is safe to turn the baby from a breech position (attempt an external cephalic version).  It is safe to try a VBAC with twins.  VBAC may not be successful if your baby weights 8.8 lb (4 kg) or more. However, weight predictions are not always accurate and should not be used alone to decide if VBAC is right for you.  There is an increased failure rate  if the time between the cesarean delivery and VBAC is less than 19 months.  Your health care provider may advise against a VBAC if you have preeclampsia (high blood pressure, protein in the urine, and swelling of face and extremities).  VBAC is often successful if you previously gave birth vaginally.  VBAC is often successful when the labor starts spontaneously before the due date.  Delivering a baby through a VBAC is similar to having a normal spontaneous vaginal delivery. This information is not intended to replace advice given to you by your health care provider. Make sure you discuss any questions you have with your health care provider. Document Released: 08/24/2006 Document Revised: 08/09/2015 Document Reviewed: 09/30/2012 Elsevier Interactive Patient Education  2018 Elsevier Inc.  

## 2017-12-09 ENCOUNTER — Other Ambulatory Visit (HOSPITAL_COMMUNITY)
Admission: RE | Admit: 2017-12-09 | Discharge: 2017-12-09 | Disposition: A | Discharge status: Home / Self Care | Payer: BLUE CROSS/BLUE SHIELD | Source: Ambulatory Visit | Attending: Obstetrics & Gynecology | Admitting: Obstetrics & Gynecology

## 2017-12-09 ENCOUNTER — Ambulatory Visit (INDEPENDENT_AMBULATORY_CARE_PROVIDER_SITE_OTHER): Payer: BLUE CROSS/BLUE SHIELD | Admitting: Obstetrics & Gynecology

## 2017-12-09 VITALS — BP 125/80 | HR 106 | Wt 191.0 lb

## 2017-12-09 DIAGNOSIS — Z3480 Encounter for supervision of other normal pregnancy, unspecified trimester: Secondary | ICD-10-CM

## 2017-12-09 DIAGNOSIS — Z3483 Encounter for supervision of other normal pregnancy, third trimester: Secondary | ICD-10-CM

## 2017-12-09 DIAGNOSIS — Z98891 History of uterine scar from previous surgery: Secondary | ICD-10-CM

## 2017-12-09 NOTE — Patient Instructions (Signed)

## 2017-12-09 NOTE — Progress Notes (Signed)
   PRENATAL VISIT NOTE  Subjective:  Emma Carlson is a 32 y.o. [id number] at [redacted]w[redacted]d being seen today for ongoing prenatal care.  She is currently monitored for the following issues for this high-risk pregnancy and has History of VBAC; Family history of breast cancer in mother; Chronic constipation; Family history- stomach cancer; Family history of colon cancer in mother; Supervision of other normal pregnancy, antepartum; Vitamin D deficiency; and Short interval between pregnancies affecting pregnancy in third trimester, antepartum on their problem list.  Patient reports occasional contractions.  Contractions: Irregular. Vag. Bleeding: None.  Movement: Present. Denies leaking of fluid.   The following portions of the patient's history were reviewed and updated as appropriate: allergies, current medications, past family history, past medical history, past social history, past surgical history and problem list. Problem list updated.  Objective:   Vitals:   12/09/17 0907  BP: 125/80  Pulse: (!) 106  Weight: 191 lb (86.6 kg)    Fetal Status: Fetal Heart Rate (bpm): 140   Movement: Present     General:  Alert, oriented and cooperative. Patient is in no acute distress.  Skin: Skin is warm and dry. No rash noted.   Cardiovascular: Normal heart rate noted  Respiratory: Normal respiratory effort, no problems with respiration noted  Abdomen: Soft, gravid, appropriate for gestational age.  Pain/Pressure: Present     Pelvic: Cervical exam performed        Extremities: Normal range of motion.     Mental Status: Normal mood and affect. Normal behavior. Normal judgment and thought content.   Assessment and Plan:  Pregnancy: [id number] at [redacted]w[redacted]d  1. Supervision of other normal pregnancy, antepartum Routine testing - Strep Gp B NAA - Cervicovaginal ancillary only  2. History of VBAC Plans TOLAC  Preterm labor symptoms and general obstetric precautions including but not limited to vaginal bleeding,  contractions, leaking of fluid and fetal movement were reviewed in detail with the patient. Please refer to After Visit Summary for other counseling recommendations.  Return in about 1 week (around 12/16/2017).  No future appointments.  Scheryl Darter, MD

## 2017-12-10 LAB — CERVICOVAGINAL ANCILLARY ONLY
CHLAMYDIA, DNA PROBE: NEGATIVE
Neisseria Gonorrhea: NEGATIVE

## 2017-12-11 LAB — STREP GP B NAA: STREP GROUP B AG: POSITIVE — AB

## 2017-12-15 ENCOUNTER — Ambulatory Visit (INDEPENDENT_AMBULATORY_CARE_PROVIDER_SITE_OTHER): Payer: BLUE CROSS/BLUE SHIELD | Admitting: Obstetrics & Gynecology

## 2017-12-15 ENCOUNTER — Encounter: Payer: Self-pay | Admitting: Obstetrics & Gynecology

## 2017-12-15 VITALS — BP 125/82 | HR 89 | Wt 190.0 lb

## 2017-12-15 DIAGNOSIS — Z98891 History of uterine scar from previous surgery: Secondary | ICD-10-CM

## 2017-12-15 DIAGNOSIS — O09893 Supervision of other high risk pregnancies, third trimester: Secondary | ICD-10-CM

## 2017-12-15 DIAGNOSIS — Z3480 Encounter for supervision of other normal pregnancy, unspecified trimester: Secondary | ICD-10-CM

## 2017-12-15 NOTE — Progress Notes (Signed)
Patient reports good fetal movement, reports mucous plug came out Sunday. Pt complains of contractions every hour, denies bleeding.

## 2017-12-15 NOTE — Patient Instructions (Signed)
Natural Childbirth Natural childbirth is going through labor and delivery without any drugs to relieve pain. Additionally, fetal monitors are not used, a delivery is not done, and a surgical cut to enlarge the vaginal opening (episiotomy) is not made. With the help of a birthing professional (midwife or health care provider), you direct your own labor and delivery. Many women choose natural childbirth because it makes them feel more in control and in touch with their labor and delivery. Some woman also choose natural childbirth because they are concerned about medicines affecting them and their babies. Pregnant women with a high-risk pregnancy should not attempt natural childbirth. It is better to deliver the infant in a hospital if an emergency situation arises. Sometimes, a health care provider has to get involved for the health and safety of the mother and infant. Techniques for natural childbirth  The Lamaze method-This method teaches parents that having a baby is normal, healthy, and natural. It also teaches the mother to take a neutral position regarding pain medicine and numbing medicines and to make an informed decision about using these medicines when the time comes.  The Bradley method (also called husband-coached birth)-This method teaches the father or partner to be the birth coach. It encourages the mother to exercise and eat a balanced, nutritious diet. It also involves relaxation and deep breathing exercises and preparing the parents for emergency situations. Methods of dealing with labor pain and delivery  Meditation.  Yoga.  Hypnosis.  Acupuncture.  Massage.  Changing positions (walking, rocking, showering, leaning on birth balls).  Lying in warm water or a whirlpool bath.  Finding an activity that keeps your mind off of the labor pain.  Listening to soft music.  Focusing on a particular object (visual imagery). Before going into labor  Be sure you and your spouse or  partner are in agreement about having a natural childbirth.  Decide if your health care provider or a midwife will deliver your baby.  Decide if you will have your baby in the hospital, at a birthing center, or at home.  If you have children, make plans to have someone take care of them when you go to the hospital or birthing center.  Know the distance and the time it takes to go to the hospital or birthing center. Practice going there and time it to be sure.  Have a bag packed with a nightgown, bathrobe, and toiletries. Be ready to take it with you when you go into labor.  Keep phone numbers of your family and friends handy if you need to call someone when you go into labor.  Your spouse or partner should go to all the natural childbirth technique classes.  Talk with your health care provider about the possibility of a medical emergency and what will happen if that occurs. Advantages of natural childbirth  You are in control of your labor and delivery.  You will not take medicines that could affect you and the baby.  There are no invasive procedures, such as an episiotomy.  You and your spouse or partner will work together, which can increase your bond with each other.  In most delivery centers, your family and friends can be involved in the labor and delivery process. Disadvantages of natural childbirth  You will experience pain during your labor and delivery.  The methods of helping relieve your labor pains may not work for you.  You may feel disappointed if you decide to change your mind during labor and not   have a natural childbirth. After the delivery  You will be very tired.  You will be uncomfortable because of your uterus contracting. You will feel soreness around the vagina.  You may feel cold and shaky. This is a natural reaction. This information is not intended to replace advice given to you by your health care provider. Make sure you discuss any questions you  have with your health care provider. Document Released: 02/14/2008 Document Revised: 08/09/2015 Document Reviewed: 11/08/2012 Elsevier Interactive Patient Education  2017 Elsevier Inc.  

## 2017-12-15 NOTE — Progress Notes (Signed)
   PRENATAL VISIT NOTE  Subjective:  Emma Carlson is a 32 y.o. [id number] at [redacted]w[redacted]d being seen today for ongoing prenatal care.  She is currently monitored for the following issues for this high-risk pregnancy and has History of VBAC; Family history of breast cancer in mother; Chronic constipation; Family history- stomach cancer; Family history of colon cancer in mother; Supervision of other normal pregnancy, antepartum; Vitamin D deficiency; and Short interval between pregnancies affecting pregnancy in third trimester, antepartum on their problem list.  Patient reports one episode of leaking that she thinks was urine. .  Contractions: Irregular. Vag. Bleeding: None.  Movement: Present. Denies leaking of fluid.   The following portions of the patient's history were reviewed and updated as appropriate: allergies, current medications, past family history, past medical history, past social history, past surgical history and problem list. Problem list updated.  Objective:   Vitals:   12/15/17 1522  BP: 125/82  Pulse: 89  Weight: 190 lb (86.2 kg)    Fetal Status:     Movement: Present     General:  Alert, oriented and cooperative. Patient is in no acute distress.  Skin: Skin is warm and dry. No rash noted.   Cardiovascular: Normal heart rate noted  Respiratory: Normal respiratory effort, no problems with respiration noted  Abdomen: Soft, gravid, appropriate for gestational age.  Pain/Pressure: Present     Pelvic: Cervical exam performed        Extremities: Normal range of motion.  Edema: Trace  Mental Status: Normal mood and affect. Normal behavior. Normal judgment and thought content.   Assessment and Plan:  Pregnancy: [id number] at [redacted]w[redacted]d  1. Supervision of other normal pregnancy, antepartum  2. Short interval between pregnancies affecting pregnancy in third trimester, antepartum  3. History of VBAC Consent on chart.   Preterm labor symptoms and general obstetric precautions including  but not limited to vaginal bleeding, contractions, leaking of fluid and fetal movement were reviewed in detail with the patient. Please refer to After Visit Summary for other counseling recommendations.  Return in about 1 week (around 12/22/2017).  Future Appointments  Date Time Provider Department Center  12/24/2017  8:45 AM Conan Bowens, MD [facility] None    Willodean Rosenthal, MD

## 2017-12-23 ENCOUNTER — Inpatient Hospital Stay (HOSPITAL_BASED_OUTPATIENT_CLINIC_OR_DEPARTMENT_OTHER): Payer: BLUE CROSS/BLUE SHIELD

## 2017-12-23 ENCOUNTER — Encounter (HOSPITAL_COMMUNITY): Payer: Self-pay

## 2017-12-23 ENCOUNTER — Other Ambulatory Visit: Payer: Self-pay

## 2017-12-23 ENCOUNTER — Inpatient Hospital Stay (EMERGENCY_DEPARTMENT_HOSPITAL)
Admission: AD | Admit: 2017-12-23 | Discharge: 2017-12-24 | Disposition: A | Discharge status: Home / Self Care | Payer: BLUE CROSS/BLUE SHIELD | Source: Ambulatory Visit | Attending: Obstetrics & Gynecology | Admitting: Obstetrics & Gynecology

## 2017-12-23 DIAGNOSIS — Z87891 Personal history of nicotine dependence: Secondary | ICD-10-CM | POA: Diagnosis not present

## 2017-12-23 DIAGNOSIS — O34219 Maternal care for unspecified type scar from previous cesarean delivery: Secondary | ICD-10-CM

## 2017-12-23 DIAGNOSIS — Z3A37 37 weeks gestation of pregnancy: Secondary | ICD-10-CM

## 2017-12-23 DIAGNOSIS — O99824 Streptococcus B carrier state complicating childbirth: Secondary | ICD-10-CM | POA: Diagnosis not present

## 2017-12-23 DIAGNOSIS — Z0371 Encounter for suspected problem with amniotic cavity and membrane ruled out: Secondary | ICD-10-CM

## 2017-12-23 DIAGNOSIS — O368390 Maternal care for abnormalities of the fetal heart rate or rhythm, unspecified trimester, not applicable or unspecified: Secondary | ICD-10-CM

## 2017-12-23 DIAGNOSIS — O368330 Maternal care for abnormalities of the fetal heart rate or rhythm, third trimester, not applicable or unspecified: Secondary | ICD-10-CM

## 2017-12-23 DIAGNOSIS — Z3483 Encounter for supervision of other normal pregnancy, third trimester: Secondary | ICD-10-CM | POA: Diagnosis not present

## 2017-12-23 LAB — AMNISURE RUPTURE OF MEMBRANE (ROM) NOT AT ARMC: Amnisure ROM: NEGATIVE

## 2017-12-23 LAB — POCT FERN TEST: POCT Fern Test: NEGATIVE

## 2017-12-23 NOTE — MAU Note (Signed)
Pt states that she has been having ctx's the last couple weeks and states when she went to the Dr. Last week she was 2 cm.  Pt states that the ctx's have become more regular and stronger over the last 24 hours. She states they are 10-12 minutes apart.   Pt states that at 1 hour ago she experienced some leaking. She states she felt wet and had some fluid slowly running down her leg. Pt states that she is still leaking.   Pt reports +FM.   Denies vaginal bleeding.

## 2017-12-23 NOTE — MAU Provider Note (Signed)
Chief Complaint:  Contractions and Rupture of Membranes   First Provider Initiated Contact with Patient 12/23/17 2059      HPI: Emma Carlson is a 32 y.o. [id number] at [redacted]w[redacted]d who presents to maternity admissions reporting leakage of fluid x 1 episode last week and 1 episode today.  The leakage today was enough to wet her underwear and was on her inner thighs.  She did not put on a pad.  She reports contractions associated with the leaking, these became stronger and closer today, every 5-7 minutes.  There are no other symptoms. She has not tried any treatment.  She reports good fetal movement, denies vaginal bleeding, vaginal itching/burning, urinary symptoms, h/a, dizziness, n/v, or fever/chills.    HPI  Past Medical History: Past Medical History:  Diagnosis Date  . Abortion, nontherapeutic 2004   Elective at 18 weeks   . Anemia   . Nausea    mild in the pastt several weeks   . Strep throat    recurrent approx. 3 times in the past year   . Tonsillitis 2011   Severe - hosptalised in overnight in [place]     Past obstetric history: OB History  Gravida Para Term Preterm AB Living  5 2 2  0 2 2  SAB TAB Ectopic Multiple Live Births  1 1 0 0 2    # Outcome Date GA Lbr Len/2nd Weight Sex Delivery Anes PTL Lv  5 Current           4 Term 11/19/16 [redacted]w[redacted]d 20:04 / 00:32 3771 g F VBAC EPI  LIV     Birth Comments: none  3 Term 10/23/11 [redacted]w[redacted]d   F CS-LTranv   LIV     Birth Comments: System Generated. Please review and update pregnancy details.     Complications: Nuchal cord affecting delivery  2 SAB 05/29/10 [redacted]w[redacted]d         1 TAB 11/20/02 [redacted]w[redacted]d           Past Surgical History: Past Surgical History:  Procedure Laterality Date  . CESAREAN SECTION    . INDUCED ABORTION  2004   at 18 weeks (elective )  . MOUTH SURGERY      Family History: Family History  Problem Relation Age of Onset  . Breast cancer Mother   . Colon cancer Mother 100       bone marrow   . Cancer Mother     Social  History: Social History   Tobacco Use  . Smoking status: Former Games developer  . Smokeless tobacco: Never Used  Substance Use Topics  . Alcohol use: No  . Drug use: No    Allergies: No Known Allergies  Meds:  No medications prior to admission.    ROS:  Review of Systems  Constitutional: Negative for chills, fatigue and fever.  Eyes: Negative for visual disturbance.  Respiratory: Negative for shortness of breath.   Cardiovascular: Negative for chest pain.  Gastrointestinal: Positive for abdominal pain. Negative for nausea and vomiting.  Genitourinary: Positive for pelvic pain and vaginal discharge. Negative for difficulty urinating, dysuria, flank pain, vaginal bleeding and vaginal pain.  Neurological: Negative for dizziness and headaches.  Psychiatric/Behavioral: Negative.      I have reviewed patient's Past Medical Hx, Surgical Hx, Family Hx, Social Hx, medications and allergies.   Physical Exam   Patient Vitals for the past 24 hrs:  BP Temp Pulse Resp SpO2 Weight  12/24/17 0118 125/61 - 86 - - -  12/23/17 2210 - - - -  98 % -  12/23/17 1921 95/80 98.2 F (36.8 C) 97 16 98 % -  12/23/17 1908 - - - - - 87.4 kg   Constitutional: Well-developed, well-nourished female in no acute distress.  Cardiovascular: normal rate Respiratory: normal effort GI: Abd soft, non-tender, gravid appropriate for gestational age.  MS: Extremities nontender, no edema, normal ROM Neurologic: Alert and oriented x 4.  GU: Neg CVAT.  PELVIC EXAM: Cervix pink, visually closed, without lesion, moderate amount milky discharge, no pooling of fluid noted before or after valsalva, vaginal walls and external genitalia normal   Dilation: 2 Effacement (%): 50 Cervical Position: Posterior Station: -3 Presentation: Vertex Exam by:: Shade Flood, CNM   When EFM applied, FHR baseline 140, moderate variability, no accels, no decels.  After position change, PO fluids, accels present. Then, prolonged  decel x 5-6 minutes resolved with position change, O2, IV fluids.   After this isolated deceleration,  FHT:  Baseline 135  , moderate variability, accelerations present, no decelerations Contractions: q 10-15 mins   Labs: Results for orders placed or performed during the hospital encounter of 12/23/17 (from the past 24 hour(s))  Amnisure rupture of membrane (rom)not at [facility]     Status: None   Collection Time: 12/23/17 11:31 PM  Result Value Ref Range   Amnisure ROM NEGATIVE   Fern Test     Status: None   Collection Time: 12/23/17 11:33 PM  Result Value Ref Range   POCT Fern Test Negative = intact amniotic membranes    A/Positive/-- (03/28 1650)  Imaging:  No results found.  MAU Course/MDM: I have ordered labs and reviewed results.  NST reviewed. See above for description. Reactive x 2 hours prior to discharge. BPP ordered and results 8/8 Cervix 2/50/-3, unchanged in 1+ hour in MAU Ferning, pooling and amnisure negative so no evidence of labor or of ROM today  Consult Eure with presentation, exam findings and test results. Reviewed FHR tracing and BPP with Dr Despina Hidden. D/C home with return precautions [facility] scheduled appts at [facility] Return to MAU as needed for emergencies  Today's evaluation included a work-up for preterm labor which can be life-threatening for both mom and baby.  Assessment: 1. Fetal heart rate decelerations affecting management of mother   2. Encounter for suspected premature rupture of amniotic membranes, with rupture of membranes not found     Plan: Discharge home Labor precautions and fetal kick counts Follow-up Information    [facility] Follow up.   Why:  Call to reschedule early am appt on 10/10 for later this week.  Return to MAU with signs of labor or emergencies.  Contact information: [address] [place] [address] [phone]         Allergies as of 12/24/2017   No Known Allergies      Medication List    STOP taking these medications   Doxylamine-Pyridoxine ER 20-20 MG Tbcr   ondansetron 8 MG disintegrating tablet Commonly known as:  ZOFRAN-ODT     TAKE these medications   CITRANATAL BLOOM 90-1 MG Tabs Take 1 tablet by mouth daily.   cyclobenzaprine 10 MG tablet Commonly known as:  FLEXERIL Take 1 tablet (10 mg total) by mouth 3 (three) times daily as needed for muscle spasms.   prenatal multivitamin Tabs tablet Take 1 tablet by mouth daily at 12 noon.   Vitamin D (Ergocalciferol) 50000 units Caps capsule Commonly known as:  DRISDOL Take 1 capsule (50,000 Units total) by  mouth every 7 (seven) days.       Sharen Counter Certified Nurse-Midwife 12/24/2017 2:59 AM

## 2017-12-24 ENCOUNTER — Encounter: Payer: BLUE CROSS/BLUE SHIELD | Admitting: Obstetrics and Gynecology

## 2017-12-24 DIAGNOSIS — Z3A37 37 weeks gestation of pregnancy: Secondary | ICD-10-CM

## 2017-12-24 DIAGNOSIS — O368390 Maternal care for abnormalities of the fetal heart rate or rhythm, unspecified trimester, not applicable or unspecified: Secondary | ICD-10-CM

## 2017-12-24 MED ORDER — LACTATED RINGERS IV BOLUS
1000.0000 mL | Freq: Once | INTRAVENOUS | Status: AC
  Administered 2017-12-24: 1000 mL via INTRAVENOUS

## 2017-12-24 NOTE — Discharge Instructions (Signed)

## 2017-12-25 ENCOUNTER — Encounter (HOSPITAL_COMMUNITY): Payer: Self-pay

## 2017-12-25 ENCOUNTER — Inpatient Hospital Stay (HOSPITAL_COMMUNITY): Payer: BLUE CROSS/BLUE SHIELD | Admitting: Anesthesiology

## 2017-12-25 ENCOUNTER — Other Ambulatory Visit: Payer: Self-pay

## 2017-12-25 ENCOUNTER — Inpatient Hospital Stay (HOSPITAL_COMMUNITY)
Admission: AD | Admit: 2017-12-25 | Discharge: 2017-12-27 | DRG: 807 | Disposition: A | Discharge status: Home / Self Care | Payer: BLUE CROSS/BLUE SHIELD | Attending: Obstetrics & Gynecology | Admitting: Obstetrics & Gynecology

## 2017-12-25 DIAGNOSIS — Z3483 Encounter for supervision of other normal pregnancy, third trimester: Secondary | ICD-10-CM | POA: Diagnosis present

## 2017-12-25 DIAGNOSIS — O34219 Maternal care for unspecified type scar from previous cesarean delivery: Secondary | ICD-10-CM | POA: Diagnosis not present

## 2017-12-25 DIAGNOSIS — Z87891 Personal history of nicotine dependence: Secondary | ICD-10-CM | POA: Diagnosis not present

## 2017-12-25 DIAGNOSIS — Z3A37 37 weeks gestation of pregnancy: Secondary | ICD-10-CM | POA: Diagnosis not present

## 2017-12-25 DIAGNOSIS — Z98891 History of uterine scar from previous surgery: Secondary | ICD-10-CM

## 2017-12-25 DIAGNOSIS — O99824 Streptococcus B carrier state complicating childbirth: Secondary | ICD-10-CM | POA: Diagnosis present

## 2017-12-25 DIAGNOSIS — O09893 Supervision of other high risk pregnancies, third trimester: Secondary | ICD-10-CM

## 2017-12-25 LAB — CBC
HEMATOCRIT: 36.1 % (ref 36.0–46.0)
HEMOGLOBIN: 12.3 g/dL (ref 12.0–15.0)
MCH: 30.1 pg (ref 26.0–34.0)
MCHC: 34.1 g/dL (ref 30.0–36.0)
MCV: 88.3 fL (ref 80.0–100.0)
Platelets: 287 10*3/uL (ref 150–400)
RBC: 4.09 MIL/uL (ref 3.87–5.11)
RDW: 14.9 % (ref 11.5–15.5)
WBC: 17.9 10*3/uL — ABNORMAL HIGH (ref 4.0–10.5)
nRBC: 0 % (ref 0.0–0.2)

## 2017-12-25 LAB — TYPE AND SCREEN
ABO/RH(D): A POS
Antibody Screen: NEGATIVE

## 2017-12-25 LAB — RPR: RPR: NONREACTIVE

## 2017-12-25 MED ORDER — OXYCODONE-ACETAMINOPHEN 5-325 MG PO TABS: 1.0000 | ORAL_TABLET | ORAL | Status: DC | PRN

## 2017-12-25 MED ORDER — SOD CITRATE-CITRIC ACID 500-334 MG/5ML PO SOLN: 30.0000 mL | ORAL | Status: DC | PRN

## 2017-12-25 MED ORDER — FENTANYL 2.5 MCG/ML BUPIVACAINE 1/10 % EPIDURAL INFUSION (WH - ANES)
14.0000 mL/h | INTRAMUSCULAR | Status: DC | PRN
  Administered 2017-12-25: 14 mL/h via EPIDURAL
  Filled 2017-12-25: qty 100

## 2017-12-25 MED ORDER — ONDANSETRON HCL 4 MG/2ML IJ SOLN: 4.0000 mg | Freq: Four times a day (QID) | INTRAMUSCULAR | Status: DC | PRN

## 2017-12-25 MED ORDER — EPHEDRINE 5 MG/ML INJ
10.0000 mg | INTRAVENOUS | Status: DC | PRN
  Filled 2017-12-25: qty 2

## 2017-12-25 MED ORDER — SIMETHICONE 80 MG PO CHEW: 80.0000 mg | CHEWABLE_TABLET | ORAL | Status: DC | PRN

## 2017-12-25 MED ORDER — OXYTOCIN 40 UNITS IN LACTATED RINGERS INFUSION - SIMPLE MED
2.5000 [IU]/h | INTRAVENOUS | Status: DC
  Filled 2017-12-25: qty 1000

## 2017-12-25 MED ORDER — ONDANSETRON HCL 4 MG PO TABS: 4.0000 mg | ORAL_TABLET | ORAL | Status: DC | PRN

## 2017-12-25 MED ORDER — LACTATED RINGERS IV SOLN: INTRAVENOUS | Status: DC

## 2017-12-25 MED ORDER — LIDOCAINE HCL (PF) 1 % IJ SOLN
30.0000 mL | INTRAMUSCULAR | Status: DC | PRN
  Filled 2017-12-25: qty 30

## 2017-12-25 MED ORDER — COCONUT OIL OIL: 1.0000 "application " | TOPICAL_OIL | Status: DC | PRN

## 2017-12-25 MED ORDER — HYDROXYZINE HCL 50 MG PO TABS
50.0000 mg | ORAL_TABLET | Freq: Four times a day (QID) | ORAL | Status: DC | PRN
  Filled 2017-12-25: qty 1

## 2017-12-25 MED ORDER — DIBUCAINE 1 % RE OINT: 1.0000 "application " | TOPICAL_OINTMENT | RECTAL | Status: DC | PRN

## 2017-12-25 MED ORDER — ACETAMINOPHEN 325 MG PO TABS
650.0000 mg | ORAL_TABLET | ORAL | Status: DC | PRN
  Administered 2017-12-25: 650 mg via ORAL

## 2017-12-25 MED ORDER — SODIUM CHLORIDE 0.9 % IV SOLN
5.0000 10*6.[IU] | Freq: Once | INTRAVENOUS | Status: AC
  Administered 2017-12-25: 5 10*6.[IU] via INTRAVENOUS
  Filled 2017-12-25: qty 5

## 2017-12-25 MED ORDER — ACETAMINOPHEN 325 MG PO TABS
650.0000 mg | ORAL_TABLET | ORAL | Status: DC | PRN
  Filled 2017-12-25: qty 2

## 2017-12-25 MED ORDER — LIDOCAINE HCL (PF) 1 % IJ SOLN
INTRAMUSCULAR | Status: DC | PRN
  Administered 2017-12-25: 5 mL via EPIDURAL

## 2017-12-25 MED ORDER — BENZOCAINE-MENTHOL 20-0.5 % EX AERO
1.0000 "application " | INHALATION_SPRAY | CUTANEOUS | Status: DC | PRN
  Administered 2017-12-25: 1 via TOPICAL
  Filled 2017-12-25: qty 56

## 2017-12-25 MED ORDER — TETANUS-DIPHTH-ACELL PERTUSSIS 5-2.5-18.5 LF-MCG/0.5 IM SUSP: 0.5000 mL | Freq: Once | INTRAMUSCULAR | Status: DC

## 2017-12-25 MED ORDER — PHENYLEPHRINE 40 MCG/ML (10ML) SYRINGE FOR IV PUSH (FOR BLOOD PRESSURE SUPPORT)
80.0000 ug | PREFILLED_SYRINGE | INTRAVENOUS | Status: DC | PRN
  Filled 2017-12-25: qty 5

## 2017-12-25 MED ORDER — PHENYLEPHRINE 40 MCG/ML (10ML) SYRINGE FOR IV PUSH (FOR BLOOD PRESSURE SUPPORT)
80.0000 ug | PREFILLED_SYRINGE | INTRAVENOUS | Status: DC | PRN
  Filled 2017-12-25: qty 10
  Filled 2017-12-25: qty 5

## 2017-12-25 MED ORDER — DIPHENHYDRAMINE HCL 50 MG/ML IJ SOLN: 12.5000 mg | INTRAMUSCULAR | Status: DC | PRN

## 2017-12-25 MED ORDER — PRENATAL MULTIVITAMIN CH
1.0000 | ORAL_TABLET | Freq: Every day | ORAL | Status: DC
  Administered 2017-12-25 – 2017-12-26 (×2): 1 via ORAL
  Filled 2017-12-25 (×2): qty 1

## 2017-12-25 MED ORDER — IBUPROFEN 600 MG PO TABS
600.0000 mg | ORAL_TABLET | Freq: Four times a day (QID) | ORAL | Status: DC
  Administered 2017-12-25 – 2017-12-27 (×8): 600 mg via ORAL
  Filled 2017-12-25 (×8): qty 1

## 2017-12-25 MED ORDER — FLEET ENEMA 7-19 GM/118ML RE ENEM: 1.0000 | ENEMA | RECTAL | Status: DC | PRN

## 2017-12-25 MED ORDER — DOCUSATE SODIUM 100 MG PO CAPS
100.0000 mg | ORAL_CAPSULE | Freq: Two times a day (BID) | ORAL | Status: DC
  Administered 2017-12-25 – 2017-12-27 (×3): 100 mg via ORAL
  Filled 2017-12-25 (×3): qty 1

## 2017-12-25 MED ORDER — DIPHENHYDRAMINE HCL 25 MG PO CAPS: 25.0000 mg | ORAL_CAPSULE | Freq: Four times a day (QID) | ORAL | Status: DC | PRN

## 2017-12-25 MED ORDER — LACTATED RINGERS IV SOLN
500.0000 mL | INTRAVENOUS | Status: DC | PRN
  Administered 2017-12-25: 1000 mL via INTRAVENOUS

## 2017-12-25 MED ORDER — PENICILLIN G 3 MILLION UNITS IVPB - SIMPLE MED
3.0000 10*6.[IU] | INTRAVENOUS | Status: DC
  Filled 2017-12-25 (×7): qty 100

## 2017-12-25 MED ORDER — FENTANYL CITRATE (PF) 100 MCG/2ML IJ SOLN: 50.0000 ug | INTRAMUSCULAR | Status: DC | PRN

## 2017-12-25 MED ORDER — ZOLPIDEM TARTRATE 5 MG PO TABS: 5.0000 mg | ORAL_TABLET | Freq: Every evening | ORAL | Status: DC | PRN

## 2017-12-25 MED ORDER — ONDANSETRON HCL 4 MG/2ML IJ SOLN: 4.0000 mg | INTRAMUSCULAR | Status: DC | PRN

## 2017-12-25 MED ORDER — OXYCODONE-ACETAMINOPHEN 5-325 MG PO TABS: 2.0000 | ORAL_TABLET | ORAL | Status: DC | PRN

## 2017-12-25 MED ORDER — WITCH HAZEL-GLYCERIN EX PADS: 1.0000 "application " | MEDICATED_PAD | CUTANEOUS | Status: DC | PRN

## 2017-12-25 MED ORDER — SENNOSIDES-DOCUSATE SODIUM 8.6-50 MG PO TABS
2.0000 | ORAL_TABLET | ORAL | Status: DC
  Administered 2017-12-26: 2 via ORAL
  Filled 2017-12-25 (×2): qty 2

## 2017-12-25 MED ORDER — LACTATED RINGERS IV SOLN: 500.0000 mL | Freq: Once | INTRAVENOUS | Status: DC

## 2017-12-25 MED ORDER — OXYTOCIN BOLUS FROM INFUSION
500.0000 mL | Freq: Once | INTRAVENOUS | Status: AC
  Administered 2017-12-25: 500 mL via INTRAVENOUS

## 2017-12-25 NOTE — Anesthesia Postprocedure Evaluation (Signed)
Anesthesia Post Note  Patient: Emma Carlson  Procedure(s) Performed: AN AD HOC LABOR EPIDURAL     Patient location during evaluation: Mother Baby Anesthesia Type: Epidural Level of consciousness: awake and alert Pain management: pain level controlled Vital Signs Assessment: post-procedure vital signs reviewed and stable Respiratory status: spontaneous breathing, nonlabored ventilation and respiratory function stable Cardiovascular status: stable Postop Assessment: no headache, no backache and epidural receding Anesthetic complications: no    Last Vitals:  Vitals:   12/25/17 1145 12/25/17 1250  BP: 110/76 111/65  Pulse: 72 75  Resp: 18   Temp: 36.8 C 36.8 C  SpO2: 99% 98%    Last Pain:  Vitals:   12/25/17 1250  TempSrc: Oral  PainSc:    Pain Goal: Patients Stated Pain Goal: 4 (12/25/17 0818)               Tamaiya Bump     

## 2017-12-25 NOTE — H&P (Addendum)
LABOR AND DELIVERY ADMISSION HISTORY AND PHYSICAL NOTE  Emma Carlson is a 32 y.o. female [phone] with IUP at [redacted]w[redacted]d by LMP c/w early U/S presenting in labor. She noted vaginal bleeding and started having more consistent and intense contractions this morning. She denies any leakage of fluid prior to arrival. She reports positive fetal movement.   Prenatal History/Complications: PNC at [facility]  Pregnancy complications: History of c-section with successful VBAC, Vitamin D deficiency, short interval between pregnancies, and family history of colon/stomach/breast cancer    Past Medical History: Past Medical History:  Diagnosis Date  . Abortion, nontherapeutic 2004   Elective at 18 weeks   . Anemia   . Nausea    mild in the pastt several weeks   . Strep throat    recurrent approx. 3 times in the past year   . Tonsillitis 2011   Severe - hosptalised in overnight in [place]     Past Surgical History: Past Surgical History:  Procedure Laterality Date  . CESAREAN SECTION    . INDUCED ABORTION  2004   at 18 weeks (elective )  . MOUTH SURGERY      Obstetrical History: OB History    Gravida  5   Para  2   Term  2   Preterm  0   AB  2   Living  2     SAB  1   TAB  1   Ectopic  0   Multiple  0   Live Births  2           Social History: Social History   Socioeconomic History  . Marital status: Married    Spouse name: Not on file  . Number of children: 1  . Years of education: Not on file  . Highest education level: Not on file  Occupational History  . Occupation: hairdresser     Associate Professor: Psychologist, clinical  Social Needs  . Financial resource strain: Not on file  . Food insecurity:    Worry: Not on file    Inability: Not on file  . Transportation needs:    Medical: Not on file    Non-medical: Not on file  Tobacco Use  . Smoking status: Former Games developer  . Smokeless tobacco: Never Used  Substance and Sexual Activity  . Alcohol use: No  . Drug use: No  .  Sexual activity: Not Currently    Partners: Male    Birth control/protection: None  Lifestyle  . Physical activity:    Days per week: Not on file    Minutes per session: Not on file  . Stress: Not on file  Relationships  . Social connections:    Talks on phone: Not on file    Gets together: Not on file    Attends religious service: Not on file    Active member of club or organization: Not on file    Attends meetings of clubs or organizations: Not on file    Relationship status: Not on file  Other Topics Concern  . Not on file  Social History Narrative  . Not on file    Family History: Family History  Problem Relation Age of Onset  . Breast cancer Mother   . Colon cancer Mother 21       bone marrow   . Cancer Mother     Allergies: No Known Allergies  Medications Prior to Admission  Medication Sig Dispense Refill Last Dose  . cyclobenzaprine (FLEXERIL) 10 MG  tablet Take 1 tablet (10 mg total) by mouth 3 (three) times daily as needed for muscle spasms. 30 tablet 1 Taking  . Prenatal Vit-Fe Fumarate-FA (PRENATAL MULTIVITAMIN) TABS tablet Take 1 tablet by mouth daily at 12 noon.   Taking  . Prenatal-DSS-FeCb-FeGl-FA (CITRANATAL BLOOM) 90-1 MG TABS Take 1 tablet by mouth daily. 30 tablet 6 Taking  . Vitamin D, Ergocalciferol, (DRISDOL) 50000 units CAPS capsule Take 1 capsule (50,000 Units total) by mouth every 7 (seven) days. 20 capsule 12 Taking     Review of Systems  All systems reviewed and negative except as stated in HPI  Physical Exam Blood pressure (!) 118/91, pulse 78, temperature 98.3 F (36.8 C), temperature source Oral, resp. rate 18, height 5\' 3"  (1.6 m), weight 87.1 kg, last menstrual period 03/30/2017, SpO2 99 %, not currently breastfeeding. General appearance: alert, oriented, NAD Lungs: normal respiratory effort Heart: regular rate Abdomen: soft, non-tender; gravid, FH appropriate for GA Extremities: No calf swelling or tenderness Presentation: cephalic  by sutures in evaluation this am  Fetal monitoring: baseline 120s, mod variability, freq early decels, + accels  Uterine activity: every 2 min  Dilation: 9 Effacement (%): 100 Station: 0 Exam by:: Foley,rn  Prenatal labs: ABO, Rh: A/Positive/-- (03/28 1650) Antibody: Negative (03/28 1650) Rubella: 3.10 (03/28 1650) RPR: Non Reactive (08/01 1020)  HBsAg: Negative (03/28 1650)  HIV: Non Reactive (08/01 1020)  GC/Chlamydia: Negative  GBS: Positive (09/25 1121)  2-hr GTT: normal  Genetic screening:  AFP negative  Anatomy US: Normal   Prenatal Transfer Tool  Maternal Diabetes: No Genetic Screening: Normal Maternal Ultrasounds/Referrals: Normal Fetal Ultrasounds or other Referrals:  None Maternal Substance Abuse:  No Significant Maternal Medications:  None Significant Maternal Lab Results: Lab values include: Group B Strep positive  Results for orders placed or performed during the hospital encounter of 12/25/17 (from the past 24 hour(s))  CBC   Collection Time: 12/25/17  8:22 AM  Result Value Ref Range   WBC 17.9 (H) 4.0 - 10.5 K/uL   RBC 4.09 3.87 - 5.11 MIL/uL   Hemoglobin 12.3 12.0 - 15.0 g/dL   HCT 59.5 63.8 - 75.6 %   MCV 88.3 80.0 - 100.0 fL   MCH 30.1 26.0 - 34.0 pg   MCHC 34.1 30.0 - 36.0 g/dL   RDW 43.3 29.5 - 18.8 %   Platelets 287 150 - 400 K/uL   nRBC 0.0 0.0 - 0.2 %    Patient Active Problem List   Diagnosis Date Noted  . Normal labor 12/25/2017  . Short interval between pregnancies affecting pregnancy in third trimester, antepartum 10/28/2017  . Vitamin D deficiency 06/17/2017  . Supervision of other normal pregnancy, antepartum 06/10/2017  . Family history of breast cancer in mother 01/06/2017  . Chronic constipation 01/06/2017  . Family history- stomach cancer 01/06/2017  . Family history of colon cancer in mother 01/06/2017  . History of VBAC 11/19/2016    Assessment: Emma Carlson is a 32 y.o. [id number] at [redacted]w[redacted]d here for SOL. Pregnancy  complicated by history of c-section with successful VBAC, Vitamin D deficiency, short interval between pregnancies, and family history of colon/stomach/breast cancer. GBS positive.     #Labor: SOL, progressing well. SROM in the MAU with meconium stained amniotic fluid.  #Pain: Epidural  #FWB: Cat 1 strip, vertex by sutures  #ID: GBS positive, PCN  #MOF: Bottle feeding  #MOC: Husband- Vasectomy  #Circ: Yes, would like inpatient if possible.   1. Elevated BP: Note one elevated  BP of 154/94, with no history of hypertensive disorder during pregnancy. Recheck wnl. Will monitor closely.   Allayne Stack 12/25/2017, 9:39 AM   I confirm that I have verified the information documented in the resident's note and that I have also personally reperformed the physical exam and all medical decision making activities.  Luna Kitchens

## 2017-12-25 NOTE — Anesthesia Preprocedure Evaluation (Signed)
Anesthesia Evaluation    Reviewed: Allergy & Precautions, Patient's Chart, lab work & pertinent test results  Airway Mallampati: II       Dental no notable dental hx.    Pulmonary former smoker,    Pulmonary exam normal        Cardiovascular negative cardio ROS Normal cardiovascular exam     Neuro/Psych    GI/Hepatic negative GI ROS, Neg liver ROS,   Endo/Other  negative endocrine ROS  Renal/GU negative Renal ROS     Musculoskeletal negative musculoskeletal ROS (+)   Abdominal Normal abdominal exam  (+)   Peds  Hematology negative hematology ROS (+)   Anesthesia Other Findings   Reproductive/Obstetrics (+) Pregnancy                             Anesthesia Physical Anesthesia Plan  ASA: II  Anesthesia Plan: Epidural   Post-op Pain Management:    Induction:   PONV Risk Score and Plan:   Airway Management Planned: Natural Airway  Additional Equipment: None  Intra-op Plan:   Post-operative Plan:   Informed Consent: I have reviewed the patients History and Physical, chart, labs and discussed the procedure including the risks, benefits and alternatives for the proposed anesthesia with the patient or authorized representative who has indicated his/her understanding and acceptance.     Plan Discussed with:   Anesthesia Plan Comments: (Lab Results      Component                Value               Date                      WBC                      17.9 (H)            12/25/2017                HGB                      12.3                12/25/2017                HCT                      36.1                12/25/2017                MCV                      88.3                12/25/2017                PLT                      287                 12/25/2017           )        Anesthesia Quick Evaluation

## 2017-12-25 NOTE — Anesthesia Procedure Notes (Signed)
Epidural Patient location during procedure: OB Start time: 12/25/2017 8:59 AM End time: 12/25/2017 9:05 AM  Staffing Anesthesiologist: Shelton Silvas, MD Performed: anesthesiologist   Preanesthetic Checklist Completed: patient identified, site marked, surgical consent, pre-op evaluation, timeout performed, IV checked, risks and benefits discussed and monitors and equipment checked  Epidural Patient position: sitting Prep: ChloraPrep Patient monitoring: heart rate, continuous pulse ox and blood pressure Approach: midline Location: L3-L4 Injection technique: LOR saline  Needle:  Needle type: Tuohy  Needle gauge: 17 G Needle length: 9 cm Catheter type: closed end flexible Catheter size: 20 Guage Test dose: negative and 1.5% lidocaine  Assessment Events: blood not aspirated, injection not painful, no injection resistance and no paresthesia  Additional Notes LOR @ 5  Patient identified. Risks/Benefits/Options discussed with patient including but not limited to bleeding, infection, nerve damage, paralysis, failed block, incomplete pain control, headache, blood pressure changes, nausea, vomiting, reactions to medications, itching and postpartum back pain. Confirmed with bedside nurse the patient's most recent platelet count. Confirmed with patient that they are not currently taking any anticoagulation, have any bleeding history or any family history of bleeding disorders. Patient expressed understanding and wished to proceed. All questions were answered. Sterile technique was used throughout the entire procedure. Please see nursing notes for vital signs. Test dose was given through epidural catheter and negative prior to continuing to dose epidural or start infusion. Warning signs of high block given to the patient including shortness of breath, tingling/numbness in hands, complete motor block, or any concerning symptoms with instructions to call for help. Patient was given instructions  on fall risk and not to get out of bed. All questions and concerns addressed with instructions to call with any issues or inadequate analgesia.    Reason for block:procedure for pain

## 2017-12-25 NOTE — MAU Provider Note (Addendum)
Chief Complaint:  Contractions and Vaginal Bleeding   First Provider Initiated Contact with Patient 12/25/17 0700   HPI: Emma Carlson is a 32 y.o. [id number] at 45w4dwho presents to maternity admissions reporting vaginal bleeding this morning which seemed watery.  There have been contractions overnight but they got stronger this morning. Pregnancy has been uneventful per pt report. . She reports good fetal movement, denies LOF, vaginal bleeding, vaginal itching/burning, urinary symptoms, h/a, dizziness, n/v, diarrhea, constipation or fever/chills.  She denies headache, visual changes or RUQ abdominal pain.  Vaginal Bleeding  The patient's primary symptoms include pelvic pain and vaginal bleeding. The patient's pertinent negatives include no genital itching, genital lesions or genital odor. This is a new problem. The current episode started today. The problem occurs intermittently. The problem has been unchanged. The pain is moderate. She is pregnant. Associated symptoms include abdominal pain. Pertinent negatives include no constipation, diarrhea, fever, nausea or vomiting. The vaginal discharge was bloody and thin. The vaginal bleeding is heavier than menses. She has not been passing clots. She has not been passing tissue. Nothing aggravates the symptoms. She has tried nothing for the symptoms.     Past Medical History: Past Medical History:  Diagnosis Date  . Abortion, nontherapeutic 2004   Elective at 18 weeks   . Anemia   . Nausea    mild in the pastt several weeks   . Strep throat    recurrent approx. 3 times in the past year   . Tonsillitis 2011   Severe - hosptalised in overnight in [place]     Past obstetric history: OB History  Gravida Para Term Preterm AB Living  5 2 2  0 2 2  SAB TAB Ectopic Multiple Live Births  1 1 0 0 2    # Outcome Date GA Lbr Len/2nd Weight Sex Delivery Anes PTL Lv  5 Current           4 Term 11/19/16 [redacted]w[redacted]d 20:04 / 00:32 3771 g F VBAC EPI  LIV      Birth Comments: none  3 Term 10/23/11 [redacted]w[redacted]d   F CS-LTranv   LIV     Birth Comments: System Generated. Please review and update pregnancy details.     Complications: Nuchal cord affecting delivery  2 SAB 05/29/10 [redacted]w[redacted]d         1 TAB 11/20/02 [redacted]w[redacted]d           Past Surgical History: Past Surgical History:  Procedure Laterality Date  . CESAREAN SECTION    . INDUCED ABORTION  2004   at 18 weeks (elective )  . MOUTH SURGERY      Family History: Family History  Problem Relation Age of Onset  . Breast cancer Mother   . Colon cancer Mother 28       bone marrow   . Cancer Mother     Social History: Social History   Tobacco Use  . Smoking status: Former Games developer  . Smokeless tobacco: Never Used  Substance Use Topics  . Alcohol use: No  . Drug use: No    Allergies: No Known Allergies  Meds:  Medications Prior to Admission  Medication Sig Dispense Refill Last Dose  . cyclobenzaprine (FLEXERIL) 10 MG tablet Take 1 tablet (10 mg total) by mouth 3 (three) times daily as needed for muscle spasms. 30 tablet 1 Taking  . Prenatal Vit-Fe Fumarate-FA (PRENATAL MULTIVITAMIN) TABS tablet Take 1 tablet by mouth daily at 12 noon.   Taking  . Prenatal-DSS-FeCb-FeGl-FA (  CITRANATAL BLOOM) 90-1 MG TABS Take 1 tablet by mouth daily. 30 tablet 6 Taking  . Vitamin D, Ergocalciferol, (DRISDOL) 50000 units CAPS capsule Take 1 capsule (50,000 Units total) by mouth every 7 (seven) days. 20 capsule 12 Taking    I have reviewed patient's Past Medical Hx, Surgical Hx, Family Hx, Social Hx, medications and allergies.   ROS:  Review of Systems  Constitutional: Negative for fever.  Gastrointestinal: Positive for abdominal pain. Negative for constipation, diarrhea, nausea and vomiting.  Genitourinary: Positive for pelvic pain and vaginal bleeding.   Other systems negative  Physical Exam   Patient Vitals for the past 24 hrs:  BP Temp Pulse SpO2  12/25/17 0700 125/79 98.2 F (36.8 C) 96 99 %    Constitutional: Well-developed, well-nourished female in no acute distress.  Cardiovascular: normal rate and rhythm Respiratory: normal effort, clear to auscultation bilaterally GI: Abd soft, non-tender, gravid appropriate for gestational age.   No rebound or guarding. MS: Extremities nontender, no edema, normal ROM Neurologic: Alert and oriented x 4.  GU: Neg CVAT.  PELVIC EXAM: Cervix pink, visually closed, without lesion, small amount of watery bloody fluid in vault, vaginal walls and external genitalia normal  Negative pooling, negative ferning, bulging membranes Placenta posterior by [place]  Dilation: 2.5 Effacement (%): 80 Cervical Position: Posterior Presentation: Vertex(confirmed by bedside ultrasound) Exam by:: Wynelle Bourgeois, CNM  FHT:  Baseline 140 , moderate variability, accelerations present, no decelerations Contractions: q 2 mins Irregular    Labs: No results found for this or any previous visit (from the past 24 hour(s)). A/Positive/-- (03/28 1650)  Imaging:    MAU Course/MDM: NST reviewed, equivocal, no decels but nonreactive History of cesarean followed by VBAC, area of tenderness on right of incision may be concerning for disruption. Treatments in MAU included EFM, observation.    SROM at [id number] with meconium fluid Cervix now changed Dilation: 5 Effacement (%): 80 Cervical Position: Posterior Presentation: Vertex(confirmed by bedside ultrasound) Exam by:: Wynelle Bourgeois, CNM   Assessment: Single intrauterine pregnancy at [redacted]w[redacted]d Watery, bloody vaginal discharge Latent phase labor TOLAC  Plan: Admit to Birthing Suites Routine orders Anticipate VBAC  Wynelle Bourgeois CNM, MSN Certified Nurse-Midwife 12/25/2017 7:23 AM

## 2017-12-25 NOTE — MAU Note (Signed)
Was getting her daughter ready for school this AM and had a large gush of blood run down her leg.  Started contracting on the way to the hospital.  + FM.  No complications w/ the pregnancy.

## 2017-12-25 NOTE — Anesthesia Postprocedure Evaluation (Signed)
Anesthesia Post Note  Patient: Emma Carlson  Procedure(s) Performed: AN AD HOC LABOR EPIDURAL     Patient location during evaluation: Mother Baby Anesthesia Type: Epidural Level of consciousness: awake and alert Pain management: pain level controlled Vital Signs Assessment: post-procedure vital signs reviewed and stable Respiratory status: spontaneous breathing, nonlabored ventilation and respiratory function stable Cardiovascular status: stable Postop Assessment: no headache, no backache and epidural receding Anesthetic complications: no    Last Vitals:  Vitals:   12/25/17 1145 12/25/17 1250  BP: 110/76 111/65  Pulse: 72 75  Resp: 18   Temp: 36.8 C 36.8 C  SpO2: 99% 98%    Last Pain:  Vitals:   12/25/17 1250  TempSrc: Oral  PainSc:    Pain Goal: Patients Stated Pain Goal: 4 (12/25/17 0818)               Fanny Dance

## 2017-12-26 LAB — CBC
HCT: 29.8 % — ABNORMAL LOW (ref 36.0–46.0)
HEMOGLOBIN: 10.1 g/dL — AB (ref 12.0–15.0)
MCH: 29.7 pg (ref 26.0–34.0)
MCHC: 33.9 g/dL (ref 30.0–36.0)
MCV: 87.6 fL (ref 80.0–100.0)
Platelets: 245 10*3/uL (ref 150–400)
RBC: 3.4 MIL/uL — AB (ref 3.87–5.11)
RDW: 14.7 % (ref 11.5–15.5)
WBC: 20.1 10*3/uL — ABNORMAL HIGH (ref 4.0–10.5)
nRBC: 0 % (ref 0.0–0.2)

## 2017-12-26 NOTE — Progress Notes (Signed)
Post Partum Day 1 Subjective: no complaints, up ad lib, voiding and tolerating PO  Objective: Blood pressure 104/63, pulse 67, temperature 98.2 F (36.8 C), temperature source Oral, resp. rate 18, height 5\' 3"  (1.6 m), weight 87.1 kg, last menstrual period 03/30/2017, SpO2 98 %, unknown if currently breastfeeding.  Physical Exam:  General: alert, cooperative and no distress Lochia: appropriate Uterine Fundus: firm Incision: n/a DVT Evaluation: No evidence of DVT seen on physical exam.  Recent Labs    12/25/17 0822 12/26/17 0604  HGB 12.3 10.1*  HCT 36.1 29.8*    Assessment/Plan: Plan for discharge tomorrow, Circumcision prior to discharge and Contraception vasectomy Wanted to go home today but baby needs to stay 2 days  LOS: 1 day   Wynelle Bourgeois 12/26/2017, 6:31 AM

## 2017-12-27 MED ORDER — IBUPROFEN 600 MG PO TABS: 600.0000 mg | ORAL_TABLET | Freq: Four times a day (QID) | ORAL | 0 refills | Status: AC

## 2017-12-27 NOTE — Discharge Summary (Signed)
Obstetrics Discharge Summary OB/[facility]N [facility]   Patient Name: Emma Carlson DOB: 1985/06/29 MRN: [id number]  Date of admission: 12/25/2017 Delivering MD: Allayne Stack   Date of discharge: 12/27/2017  Admitting diagnosis: 38WKS BLEEDING Intrauterine pregnancy: [redacted]w[redacted]d     Secondary diagnosis:   Active Problems:   History of VBAC   Short interval between pregnancies affecting pregnancy in third trimester, antepartum   Normal labor    Discharge diagnosis: VBAC                                            Postpartum procedures: None  Complications: none  Hospital course: Emma Carlson is a 32 y.o. [redacted]w[redacted]d who was admitted for spontaneous onset of labor, vaginal bleeding. Her pregnancy was complicated by history of c-section with successful VBAC, short interval pregnancy. Her labor course was notable for precipitous labor. Delivery was complicated by thick meconium stained fluid, superficial labial abrasions. Please see delivery/op note for additional details. Her postpartum course was uncomplicated. She was bottle feeding. By day of discharge, she was passing flatus, urinating, eating and drinking without difficulty. Her pain was well-controlled, and she was discharged home with ibuprofen. She will follow-up in clinic in 4-6 weeks.   Physical exam  Vitals:   12/26/17 0519 12/26/17 1456 12/26/17 2124 12/27/17 0523  BP: 104/63 107/80 (!) 94/56 (!) 96/56  Pulse: 67 73 73 71  Resp: 18 17  18   Temp: 98.2 F (36.8 C) 99 F (37.2 C) 98.4 F (36.9 C) 97.9 F (36.6 C)  TempSrc: Oral Oral Oral Oral  SpO2:  100% 99%   Weight:      Height:       General: well-appearing, NAD  Lochia: appropriate Uterine Fundus: firm Incision: N/A DVT Evaluation: No significant calf/ankle edema. Labs: Lab Results  Component Value Date   WBC 20.1 (H) 12/26/2017   HGB 10.1 (L) 12/26/2017   HCT 29.8 (L) 12/26/2017   MCV 87.6 12/26/2017   PLT 245 12/26/2017   CMP Latest Ref Rng & Units  10/17/2015  Glucose 65 - 99 mg/dL 76  BUN 7 - 25 mg/dL 7  Creatinine 4.09 - 8.11 mg/dL 9.14  Sodium 782 - 956 mmol/L 140  Potassium 3.5 - 5.3 mmol/L 3.7  Chloride 98 - 110 mmol/L 106  CO2 20 - 31 mmol/L 22  Calcium 8.6 - 10.2 mg/dL 8.9  Total Protein 6.1 - 8.1 g/dL 7.3  Total Bilirubin 0.2 - 1.2 mg/dL 0.4  Alkaline Phos 33 - 115 U/L 58  AST 10 - 30 U/L 16  ALT 6 - 29 U/L 14    Discharge instructions: Per After Visit Summary and "Baby and Me Booklet"  After visit meds:  Allergies as of 12/27/2017   No Known Allergies     Medication List    TAKE these medications   CITRANATAL BLOOM 90-1 MG Tabs Take 1 tablet by mouth daily.   cyclobenzaprine 10 MG tablet Commonly known as:  FLEXERIL Take 1 tablet (10 mg total) by mouth 3 (three) times daily as needed for muscle spasms.   ibuprofen 600 MG tablet Commonly known as:  ADVIL,MOTRIN Take 1 tablet (600 mg total) by mouth every 6 (six) hours.   Vitamin D (Ergocalciferol) 50000 units Caps capsule Commonly known as:  DRISDOL Take 1 capsule (50,000 Units total) by mouth every 7 (seven) days.  Postpartum contraception: Vasectomy Diet: Routine Diet Activity: Advance as tolerated. Pelvic rest for 6 weeks.   Outpatient follow up:4-6 weeks, message sent to schedule follow-up Follow-up Appt: Future Appointments  Date Time Provider Department Center  12/28/2017  1:45 PM Anyanwu, Jethro Bastos, MD [facility] None   Newborn Data: Live born female  Birth Weight: 8 lb 1.6 oz (3675 g) APGAR: 8, 9  Newborn Delivery   Birth date/time:  12/25/2017 10:02:00 Delivery type:  VBAC, Spontaneous    Baby Feeding: Bottle Disposition:home with mother  Cristal Deer. Earlene Plater, DO OB/GYN Fellow, Faculty Practice

## 2017-12-28 ENCOUNTER — Encounter: Payer: BLUE CROSS/BLUE SHIELD | Admitting: Obstetrics & Gynecology

## 2018-01-25 ENCOUNTER — Ambulatory Visit (INDEPENDENT_AMBULATORY_CARE_PROVIDER_SITE_OTHER): Payer: BLUE CROSS/BLUE SHIELD | Admitting: Obstetrics and Gynecology

## 2018-01-25 ENCOUNTER — Encounter: Payer: Self-pay | Admitting: Obstetrics and Gynecology

## 2018-01-25 DIAGNOSIS — Z3042 Encounter for surveillance of injectable contraceptive: Secondary | ICD-10-CM

## 2018-01-25 MED ORDER — MEDROXYPROGESTERONE ACETATE 150 MG/ML IM SUSP: 150.0000 mg | INTRAMUSCULAR | 0 refills | Status: AC

## 2018-01-25 MED ORDER — MEDROXYPROGESTERONE ACETATE 150 MG/ML IM SUSP
150.0000 mg | Freq: Once | INTRAMUSCULAR | Status: AC
  Administered 2018-01-25: 150 mg via INTRAMUSCULAR

## 2018-01-25 MED ORDER — NIFEDIPINE ER OSMOTIC RELEASE 30 MG PO TB24: 30.0000 mg | ORAL_TABLET | Freq: Every day | ORAL | 2 refills | Status: AC

## 2018-01-25 NOTE — Progress Notes (Signed)
Post Partum Exam  Emma Carlson is a 32 y.o. [phone] female who presents for a postpartum visit. She is 4 weeks postpartum following a spontaneous vaginal delivery. I have fully reviewed the prenatal and intrapartum course. The delivery was at [redacted]w[redacted]d gestational weeks.  Anesthesia: epidural. Postpartum course has been unremarkable. Baby's course has been unremarkable. Baby is feeding by bottle - [employer] Good Start. Bleeding no bleeding. Bowel function is normal. Bladder function is normal. Patient is not sexually active. Contraception method is abstinence. Postpartum depression screening:neg    Last pap smear done 01/06/17 and was Normal  Review of Systems Pertinent items are noted in HPI.    Objective:  Blood pressure (!) 156/93, pulse 89, weight 180 lb 6.4 oz (81.8 kg), last menstrual period 01/20/2018, not currently breastfeeding.  General:  alert, cooperative and no distress   Breasts:  inspection negative, no nipple discharge or bleeding, no masses or nodularity palpable  Lungs: clear to auscultation bilaterally  Heart:  regular rate and rhythm  Abdomen: soft, non-tender; bowel sounds normal; no masses,  no organomegaly   Vulva:  normal  Vagina: normal vagina, no discharge, exudate, lesion, or erythema  Cervix:  multiparous appearance  Corpus: normal size, contour, position, consistency, mobility, non-tender  Adnexa:  normal adnexa and no mass, fullness, tenderness  Rectal Exam: Not performed.        Assessment:    Normal postpartum exam. Pap smear not done at today's visit.   Plan:   1. Contraception: Depo-Provera injections until vasectomy is completed and clearance is obtained 2. Patient is medically cleared to resume all activities of daily living. Patient with elevated BP without si/sx of preeclampsia. Rx procardia provided. Patient to follow up with PCP with persistently elevated BP 3. Follow up in: 6 months or as needed.

## 2018-04-13 DIAGNOSIS — Z713 Dietary counseling and surveillance: Secondary | ICD-10-CM | POA: Diagnosis not present

## 2018-04-13 DIAGNOSIS — Z136 Encounter for screening for cardiovascular disorders: Secondary | ICD-10-CM | POA: Diagnosis not present

## 2018-04-13 DIAGNOSIS — Z6830 Body mass index (BMI) 30.0-30.9, adult: Secondary | ICD-10-CM | POA: Diagnosis not present

## 2018-04-13 DIAGNOSIS — Z13220 Encounter for screening for lipoid disorders: Secondary | ICD-10-CM | POA: Diagnosis not present

## 2018-05-20 ENCOUNTER — Ambulatory Visit (INDEPENDENT_AMBULATORY_CARE_PROVIDER_SITE_OTHER): Payer: BLUE CROSS/BLUE SHIELD

## 2018-05-20 DIAGNOSIS — Z3202 Encounter for pregnancy test, result negative: Secondary | ICD-10-CM

## 2018-05-20 DIAGNOSIS — L600 Ingrowing nail: Secondary | ICD-10-CM | POA: Diagnosis not present

## 2018-05-20 DIAGNOSIS — Z3042 Encounter for surveillance of injectable contraceptive: Secondary | ICD-10-CM

## 2018-05-20 DIAGNOSIS — M25774 Osteophyte, right foot: Secondary | ICD-10-CM | POA: Diagnosis not present

## 2018-05-20 LAB — POCT URINE PREGNANCY: Preg Test, Ur: NEGATIVE

## 2018-05-20 NOTE — Progress Notes (Signed)
I have reviewed the chart and agree with nursing staff's documentation of this patient's encounter.  Madailein Londo, MD 05/20/2018 4:55 PM    

## 2018-05-20 NOTE — Progress Notes (Signed)
Patient is in the office for 1st UPT for depo restart. UPT is negative. Return in 2 weeks for 2nd UPT and depo injection.

## 2018-06-03 ENCOUNTER — Ambulatory Visit (INDEPENDENT_AMBULATORY_CARE_PROVIDER_SITE_OTHER): Payer: BLUE CROSS/BLUE SHIELD

## 2018-06-03 ENCOUNTER — Other Ambulatory Visit: Payer: Self-pay

## 2018-06-03 DIAGNOSIS — Z3042 Encounter for surveillance of injectable contraceptive: Secondary | ICD-10-CM | POA: Diagnosis not present

## 2018-06-03 LAB — POCT URINE PREGNANCY: PREG TEST UR: NEGATIVE

## 2018-06-03 MED ORDER — MEDROXYPROGESTERONE ACETATE 150 MG/ML IM SUSP
150.0000 mg | Freq: Once | INTRAMUSCULAR | Status: AC
  Administered 2018-11-18: 150 mg via INTRAMUSCULAR

## 2018-06-03 MED ORDER — MEDROXYPROGESTERONE ACETATE 150 MG/ML IM SUSP: 150.0000 mg | INTRAMUSCULAR | 2 refills | Status: DC

## 2018-06-03 NOTE — Progress Notes (Signed)
Nurse visit Depo restart. 2nd UPT neg. Denies IC x 14 days. Depo given RUOQ without difficulty.  Next Depo due June 4-18. Depo rf's sent to the pharmacy. Annual due Nov. 2020

## 2018-06-03 NOTE — Progress Notes (Signed)
I have reviewed the chart and agree with nursing staff's documentation of this patient's encounter.  Catalina Antigua, MD 06/03/2018 11:15 AM

## 2018-08-24 ENCOUNTER — Other Ambulatory Visit: Payer: Self-pay

## 2018-08-24 ENCOUNTER — Ambulatory Visit (INDEPENDENT_AMBULATORY_CARE_PROVIDER_SITE_OTHER): Payer: BC Managed Care – PPO

## 2018-08-24 DIAGNOSIS — Z3042 Encounter for surveillance of injectable contraceptive: Secondary | ICD-10-CM

## 2018-08-24 MED ORDER — MEDROXYPROGESTERONE ACETATE 150 MG/ML IM SUSP: 150.0000 mg | INTRAMUSCULAR | 2 refills | Status: DC

## 2018-08-24 MED ORDER — MEDROXYPROGESTERONE ACETATE 150 MG/ML IM SUSP
150.0000 mg | Freq: Once | INTRAMUSCULAR | Status: AC
  Administered 2018-08-24: 150 mg via INTRAMUSCULAR

## 2018-08-24 NOTE — Progress Notes (Signed)
Pt is here for depo injection. Injection is on time. Injection given in LUOQ without difficulty. Next injection due 8/25-9/8.

## 2018-11-11 ENCOUNTER — Ambulatory Visit: Payer: BC Managed Care – PPO

## 2018-11-18 ENCOUNTER — Other Ambulatory Visit: Payer: Self-pay

## 2018-11-18 ENCOUNTER — Ambulatory Visit (INDEPENDENT_AMBULATORY_CARE_PROVIDER_SITE_OTHER): Payer: BC Managed Care – PPO

## 2018-11-18 DIAGNOSIS — Z3042 Encounter for surveillance of injectable contraceptive: Secondary | ICD-10-CM

## 2018-11-18 NOTE — Progress Notes (Signed)
Nurse visit for pt supply Depo. Pt is within her window. Depo given LUOQ w/o difficulty. Next Depo due Nov 19-Dec 3, pt agrees.

## 2019-02-07 ENCOUNTER — Ambulatory Visit (INDEPENDENT_AMBULATORY_CARE_PROVIDER_SITE_OTHER): Payer: BC Managed Care – PPO

## 2019-02-07 ENCOUNTER — Other Ambulatory Visit: Payer: Self-pay

## 2019-02-07 DIAGNOSIS — Z3042 Encounter for surveillance of injectable contraceptive: Secondary | ICD-10-CM | POA: Diagnosis not present

## 2019-02-07 MED ORDER — MEDROXYPROGESTERONE ACETATE 150 MG/ML IM SUSP
150.0000 mg | INTRAMUSCULAR | Status: AC
  Administered 2019-02-07 – 2019-05-02 (×2): 150 mg via INTRAMUSCULAR

## 2019-02-07 NOTE — Progress Notes (Signed)
Pt is in the office for depo injection, administered in RUOQ and pt tolerated well. Next due Feb 8-22. .. Administrations This Visit    medroxyPROGESTERone (DEPO-PROVERA) injection 150 mg    Admin Date 02/07/2019 Action Given Dose 150 mg Route Intramuscular Administered By Hinton Lovely, RN

## 2019-02-09 NOTE — Progress Notes (Signed)
Patient seen and assessed by nursing staff during this encounter. I have reviewed the chart and agree with the documentation and plan.  Warwick Nick, MD 02/09/2019 10:46 AM    

## 2019-05-02 ENCOUNTER — Other Ambulatory Visit: Payer: Self-pay

## 2019-05-02 ENCOUNTER — Ambulatory Visit (INDEPENDENT_AMBULATORY_CARE_PROVIDER_SITE_OTHER): Payer: BC Managed Care – PPO

## 2019-05-02 DIAGNOSIS — Z3042 Encounter for surveillance of injectable contraceptive: Secondary | ICD-10-CM

## 2019-05-02 NOTE — Progress Notes (Signed)
Pt is in the office for depo injection, administered in LUOQ and pt tolerated well. Next due May 3- May 17. .. Administrations This Visit    medroxyPROGESTERone (DEPO-PROVERA) injection 150 mg    Admin Date 05/02/2019 Action Given Dose 150 mg Route Intramuscular Administered By Katrina Stack, RN

## 2019-05-02 NOTE — Progress Notes (Signed)
Patient seen and assessed by nursing staff during this encounter. I have reviewed the chart and agree with the documentation and plan.  Catalina Antigua, MD 05/02/2019 2:18 PM

## 2019-06-05 ENCOUNTER — Other Ambulatory Visit: Payer: Self-pay | Admitting: Obstetrics

## 2019-06-05 DIAGNOSIS — Z3042 Encounter for surveillance of injectable contraceptive: Secondary | ICD-10-CM

## 2019-07-25 ENCOUNTER — Other Ambulatory Visit: Payer: Self-pay

## 2019-07-25 ENCOUNTER — Ambulatory Visit (INDEPENDENT_AMBULATORY_CARE_PROVIDER_SITE_OTHER): Payer: BC Managed Care – PPO

## 2019-07-25 DIAGNOSIS — Z3042 Encounter for surveillance of injectable contraceptive: Secondary | ICD-10-CM | POA: Diagnosis not present

## 2019-07-25 MED ORDER — MEDROXYPROGESTERONE ACETATE 150 MG/ML IM SUSP
150.0000 mg | Freq: Once | INTRAMUSCULAR | Status: AC
  Administered 2019-07-25: 150 mg via INTRAMUSCULAR

## 2019-07-25 NOTE — Progress Notes (Signed)
Patient was assessed and managed by nursing staff during this encounter. I have reviewed the chart and agree with the documentation and plan. I have also made any necessary editorial changes.  Siegfried Vieth, MD 07/25/2019 3:37 PM    

## 2019-07-25 NOTE — Progress Notes (Signed)
Emma Carlson is here for a depo injection.  Pt toleterated injection well in the RUOQ without complication. Pt advised to make next appointment between 10/10/19 - 10/24/19. -EH/RMA

## 2019-08-25 ENCOUNTER — Other Ambulatory Visit (HOSPITAL_COMMUNITY)
Admission: RE | Admit: 2019-08-25 | Discharge: 2019-08-25 | Disposition: A | Discharge status: Home / Self Care | Payer: BC Managed Care – PPO | Source: Ambulatory Visit

## 2019-08-25 ENCOUNTER — Ambulatory Visit (INDEPENDENT_AMBULATORY_CARE_PROVIDER_SITE_OTHER): Payer: BC Managed Care – PPO

## 2019-08-25 ENCOUNTER — Other Ambulatory Visit: Payer: Self-pay

## 2019-08-25 VITALS — BP 122/80 | HR 105 | Wt 189.0 lb

## 2019-08-25 DIAGNOSIS — Z01419 Encounter for gynecological examination (general) (routine) without abnormal findings: Secondary | ICD-10-CM

## 2019-08-25 DIAGNOSIS — N6341 Unspecified lump in right breast, subareolar: Secondary | ICD-10-CM | POA: Diagnosis not present

## 2019-08-25 DIAGNOSIS — E049 Nontoxic goiter, unspecified: Secondary | ICD-10-CM

## 2019-08-25 DIAGNOSIS — Z113 Encounter for screening for infections with a predominantly sexual mode of transmission: Secondary | ICD-10-CM | POA: Diagnosis not present

## 2019-08-25 DIAGNOSIS — N63 Unspecified lump in unspecified breast: Secondary | ICD-10-CM

## 2019-08-25 DIAGNOSIS — A749 Chlamydial infection, unspecified: Secondary | ICD-10-CM

## 2019-08-25 NOTE — Progress Notes (Signed)
GYNECOLOGY OFFICE VISIT NOTE-WELL WOMAN EXAM  HistoryANA Carlson [id number] here today for annual exam.  She is currently on Depo is and is satisfied with no menses, but desires a more long-term method. She states she has considered an IUD particularly the Mirena, but has not done relevant research.     Reproductive Concerns: Partners in last year: Married, One partner in last year.  She denies any abnormal vaginal discharge, bleeding, pelvic pain or pain and/or discomfort during sexual activity.   STD Testing: Desires Breast Exams: Q Wk in shower. No issues.  Family history of breast and colon-Mom who died in 9924 from CA complications. Initially diagnosed with leukemia.  No personal or family history of uterine, cervical, or ovarian cancer  Medical and Nutrition: PCP: Emma Carlson, Emma Carlson-Haven't seen this year. No Primary care issues. Recommend making appt Exercise: Emma Carlson, Walking, Jump Rope Tobacco/Drugs/Alcohol: None Nutrition: "I try.. I have been juicing."    Social: Safety at home: Yeah DV/A: None Social Support: No support despite husband being present.  Past Medical History:  Diagnosis Date   Abortion, nontherapeutic 2004   Elective at 18 weeks    Anemia    Nausea    mild in the pastt several weeks    Strep throat    recurrent approx. 3 times in the past year    Tonsillitis 2011   Severe - hosptalised in overnight in [place]     Past Surgical History:  Procedure Laterality Date   CESAREAN SECTION     INDUCED ABORTION  2004   at 18 weeks (elective )   MOUTH SURGERY      The following portions of the patient's history were reviewed and updated as appropriate: allergies, current medications, past family history, past medical history, past social history, past surgical history and problem list.   Health Maintenance:  Normal pap and negative HRHPV.   Review of Systems:  Pertinent items noted in HPI and remainder of comprehensive ROS otherwise  negative.    Objective:    Physical Exam BP 122/80    Pulse (!) 105    Wt 189 lb (85.7 kg)    BMI 33.48 kg/m  Physical Exam Exam conducted with a chaperone present.  Constitutional:      Appearance: Normal appearance. She is obese.  HENT:     Head: Normocephalic and atraumatic.  Eyes:     Conjunctiva/sclera: Conjunctivae normal.  Neck:     Thyroid: Thyromegaly present. No thyroid tenderness.     Trachea: Trachea normal.  Cardiovascular:     Rate and Rhythm: Normal rate and regular rhythm.     Heart sounds: Normal heart sounds.  Pulmonary:     Effort: Pulmonary effort is normal. No respiratory distress.     Breath sounds: Normal breath sounds.  Chest:     Breasts:        Right: No bleeding, nipple discharge or tenderness.        Left: No bleeding, mass, nipple discharge or tenderness.    Abdominal:     General: Bowel sounds are normal.     Palpations: Abdomen is soft.     Tenderness: There is no abdominal tenderness.  Genitourinary:    General: Normal vulva.     Labia:        Right: No rash or tenderness.        Left: No rash or tenderness.      Vagina: No vaginal discharge or bleeding.  Cervix: Discharge and friability present. No cervical motion tenderness or erythema.     Uterus: Normal.      Comments: CV collected from cervix and vaginal vault. Cervix with small amt thick yellowish mucoid discharge.  Friable after CV collection Musculoskeletal:        General: Normal range of motion.     Cervical back: Normal range of motion. No rigidity or tenderness.  Lymphadenopathy:     Upper Body:     Right upper body: No axillary adenopathy.     Left upper body: No axillary adenopathy.  Skin:    General: Skin is warm and dry.     Comments: Multiple Tattoos   Neurological:     Mental Status: She is alert and oriented to person, place, and time.  Psychiatric:        Mood and Affect: Mood normal.        Thought Content: Thought content normal.      Labs and  Imaging No results found for this or any previous visit (from the past 168 hour(s)). No results found.   Assessment & Plan:       1. Well woman exam with routine gynecological exam -Exam findings discussed. -Educated on ASCCP guidelines regarding pap smear evaluation and frequency. -Discussed previous pap: Normal despite absent transformation zone and Negative HPV.  -Informed that we will retest in 2 more years.  -Educated on [facility] exercise recommendations of 30 minutes of moderate to vigorous activity at least 5x/week. -Reviewed contraception methods and given verbal and pamphlet information on [place].   -Instructed to call and schedule appt for placement once she decides on desired brand.   2. Screening for STD (sexually transmitted disease) -Informed of turnover time and provider/clinic policy on releasing results. -Encouraged to activate Mychart. - Hepatitis B surface antigen - Hepatitis C antibody - HIV Antibody (routine testing w rflx) - RPR - Cervicovaginal ancillary only( [place])  3. Breast lump or mass -Encouraged to continue SBE -Discussed CBE findings and need for follow up -Reassured that findings are suspicious for fibrocystic changes, but that [place] should be performed d/t family history. -Order placed for Limited Right Breast US  4. Enlarged thyroid -Informed that thyroid is enlarged-bilaterally with some slight increase on right side. -Encouraged to follow up with PCP for further testing and evaluation.   Routine preventative health maintenance measures emphasized. Please refer to After Visit Summary for other counseling recommendations.   No follow-ups on file.      Emma Carlson, CNM 08/25/2019

## 2019-08-25 NOTE — Progress Notes (Signed)
Patient presents for Annual Exam today.  LMP: no cycle with Depo   Contraception: Depo pt wants to discuss something different.   STD Screening: Full panel   Last pap: 01/06/2017 WNL  No hx of abnormal pap's   Family Hx of  Breast Cancer: Mother   CC: None

## 2019-08-26 LAB — CERVICOVAGINAL ANCILLARY ONLY
Bacterial Vaginitis (gardnerella): POSITIVE — AB
Candida Glabrata: POSITIVE — AB
Candida Vaginitis: POSITIVE — AB
Chlamydia: POSITIVE — AB
Comment: NEGATIVE
Comment: NEGATIVE
Comment: NEGATIVE
Comment: NEGATIVE
Comment: NEGATIVE
Comment: NORMAL
Neisseria Gonorrhea: NEGATIVE
Trichomonas: NEGATIVE

## 2019-08-26 LAB — HEPATITIS B SURFACE ANTIGEN: Hepatitis B Surface Ag: NEGATIVE

## 2019-08-26 LAB — RPR: RPR Ser Ql: NONREACTIVE

## 2019-08-26 LAB — HIV ANTIBODY (ROUTINE TESTING W REFLEX): HIV Screen 4th Generation wRfx: NONREACTIVE

## 2019-08-26 LAB — HEPATITIS C ANTIBODY: Hep C Virus Ab: <0.1 s/co ratio (ref 0.0–0.9)

## 2019-08-27 ENCOUNTER — Telehealth: Payer: Self-pay | Admitting: Student

## 2019-08-27 MED ORDER — AZITHROMYCIN 500 MG PO TABS: 1000.0000 mg | ORAL_TABLET | Freq: Once | ORAL | 0 refills | Status: AC

## 2019-08-27 NOTE — Telephone Encounter (Signed)
Patient returned missed phone call and verified her identity via birth date.  Patient agreeable to results via phone and was informed of positive chlamydia.   Rx sent to pharmacy this morning by Gerrit Heck, CNM.  Partner should go to [facility] or PCP for treatment & testing.  No intercourse x 1 week after both treated.  Pt had no further questions.   Judeth Horn, NP

## 2019-08-27 NOTE — Addendum Note (Signed)
Addended by: Gerrit Heck L on: 08/27/2019 03:48 AM   Modules accepted: Orders

## 2019-08-28 ENCOUNTER — Other Ambulatory Visit: Payer: Self-pay

## 2019-08-29 ENCOUNTER — Telehealth: Payer: Self-pay

## 2019-08-29 NOTE — Telephone Encounter (Signed)
Called pt regarding positive chlamydia lab results and treatment prescribed. Pt states she was made aware and took the full treatment over the weekend. Health Dept Notification faxed

## 2019-09-06 ENCOUNTER — Ambulatory Visit
Admission: RE | Admit: 2019-09-06 | Discharge: 2019-09-06 | Disposition: A | Discharge status: Home / Self Care | Payer: BC Managed Care – PPO | Source: Ambulatory Visit

## 2019-09-06 ENCOUNTER — Other Ambulatory Visit: Payer: Self-pay

## 2019-09-06 DIAGNOSIS — N63 Unspecified lump in unspecified breast: Secondary | ICD-10-CM

## 2019-09-06 DIAGNOSIS — Z01419 Encounter for gynecological examination (general) (routine) without abnormal findings: Secondary | ICD-10-CM

## 2019-09-29 ENCOUNTER — Other Ambulatory Visit: Payer: Self-pay

## 2019-09-29 ENCOUNTER — Ambulatory Visit (INDEPENDENT_AMBULATORY_CARE_PROVIDER_SITE_OTHER): Payer: BC Managed Care – PPO

## 2019-09-29 VITALS — BP 127/77 | HR 78 | Wt 193.5 lb

## 2019-09-29 DIAGNOSIS — Z3202 Encounter for pregnancy test, result negative: Secondary | ICD-10-CM

## 2019-09-29 DIAGNOSIS — Z30430 Encounter for insertion of intrauterine contraceptive device: Secondary | ICD-10-CM | POA: Diagnosis not present

## 2019-09-29 LAB — POCT URINE PREGNANCY: Preg Test, Ur: NEGATIVE

## 2019-09-29 MED ORDER — LEVONORGESTREL 19.5 MCG/DAY IU IUD: INTRAUTERINE_SYSTEM | Freq: Once | INTRAUTERINE | Status: AC

## 2019-09-29 NOTE — Patient Instructions (Signed)

## 2019-09-29 NOTE — Progress Notes (Signed)
    GYNECOLOGY OFFICE PROCEDURE NOTE  Emma Carlson is a 34 y.o. [id number] here for Liletta IUD insertion. No GYN concerns.  Last pap smear was on Oct 2018 and was normal.  IUD Insertion Procedure Note IUD: Liletta  Exp: 12/2022 Lot: [id number]  Patient identified, informed consent performed, consent signed.   Discussed risks of irregular bleeding, cramping, infection, malpositioning or misplacement of the IUD outside the uterus which may require further procedure such as laparoscopy. Time out was performed.  Urine pregnancy test negative.  Speculum placed in the vagina and cervix visualized.  Cervix and vaginal walls cleaned x 3 with betadine solution. Posterior aspect grasped with a single tooth tenaculum.  Uterus sounded to 8 cm.  Liletta IUD placed per manufacturer's recommendations.  Strings trimmed to ~3 cm. Tenaculum was removed, good hemostasis noted.  Patient tolerated procedure well.   Patient was given post-procedure instructions.  She was advised to have backup contraception for one week.  Patient was instructed to check IUD strings after menses or every 2 months in the absence of menses. Patient also instructed to follow up in 4 weeks for IUD check and call and/or report any issues prior to next visit.  Cherre Robins, CNM 09/29/2019

## 2019-09-29 NOTE — Progress Notes (Signed)
Pt is here for IUD Liletta insertion. Pt is currently using depo injections for contraception and wishes to switch to more long term method. UPT negative.

## 2019-10-27 ENCOUNTER — Ambulatory Visit: Payer: BC Managed Care – PPO | Admitting: Obstetrics & Gynecology

## 2019-11-14 ENCOUNTER — Encounter: Payer: Self-pay | Admitting: Obstetrics and Gynecology

## 2019-11-14 ENCOUNTER — Other Ambulatory Visit (HOSPITAL_COMMUNITY)
Admission: RE | Admit: 2019-11-14 | Discharge: 2019-11-14 | Disposition: A | Discharge status: Home / Self Care | Payer: BC Managed Care – PPO | Source: Ambulatory Visit | Attending: Obstetrics and Gynecology | Admitting: Obstetrics and Gynecology

## 2019-11-14 ENCOUNTER — Ambulatory Visit (INDEPENDENT_AMBULATORY_CARE_PROVIDER_SITE_OTHER): Payer: BC Managed Care – PPO | Admitting: Obstetrics and Gynecology

## 2019-11-14 ENCOUNTER — Other Ambulatory Visit: Payer: Self-pay

## 2019-11-14 VITALS — BP 124/78 | HR 101 | Wt 191.0 lb

## 2019-11-14 DIAGNOSIS — Z30431 Encounter for routine checking of intrauterine contraceptive device: Secondary | ICD-10-CM | POA: Diagnosis present

## 2019-11-14 NOTE — Progress Notes (Signed)
34 yo here for IUD check. She reports doing well without complaints. She denies pelvic pain or abnormal discharge. She denies irregular bleeding. Patient has not been sexually active yet  Past Medical History:  Diagnosis Date  . Abortion, nontherapeutic 2004   Elective at 18 weeks   . Anemia   . Nausea    mild in the pastt several weeks   . Strep throat    recurrent approx. 3 times in the past year   . Tonsillitis 2011   Severe - hosptalised in overnight in [place]    Past Surgical History:  Procedure Laterality Date  . CESAREAN SECTION    . INDUCED ABORTION  2004   at 18 weeks (elective )  . MOUTH SURGERY     Family History  Problem Relation Age of Onset  . Breast cancer Mother   . Colon cancer Mother 48       bone marrow   . Cancer Mother    Social History   Tobacco Use  . Smoking status: Former Games developer  . Smokeless tobacco: Never Used  Vaping Use  . Vaping Use: Never used  Substance Use Topics  . Alcohol use: No  . Drug use: No   ROS See pertinent in HPI  Blood pressure 124/78, pulse (!) 101, weight 191 lb (86.6 kg), not currently breastfeeding. GENERAL: Well-developed, well-nourished female in no acute distress.  ABDOMEN: Soft, nontender, nondistended. No organomegaly. PELVIC: Normal external female genitalia. Vagina is pink and rugated.  Normal discharge. Normal appearing cervix with IUD strings extending from os 2 cm. Uterus is normal in size.  No adnexal mass or tenderness. EXTREMITIES: No cyanosis, clubbing, or edema, 2+ distal pulses.  A/P 34 yo here for IUD check - IUD appears to be in the appropriate location - Chlamydia test of cure today per patient request - Patient will be contacted with abnormal results - RTC prn

## 2019-11-14 NOTE — Progress Notes (Signed)
Pt presents for IUD check. Liletta inserted 09/29/19 No c/o with device per pt Has not had IC with it yet  +CT 08/25/19

## 2019-11-15 LAB — CERVICOVAGINAL ANCILLARY ONLY
Chlamydia: NEGATIVE
Comment: NEGATIVE
Comment: NORMAL
Neisseria Gonorrhea: NEGATIVE

## 2020-03-12 IMAGING — US US MFM OB COMP +14 WKS
1 series · 14 of 28 positions shown · non-contrast
Comparison: none

[Series 1: us mfm ob comp +14 wks · 95 acquisitions, 14 frames shown]
[im 4/95]
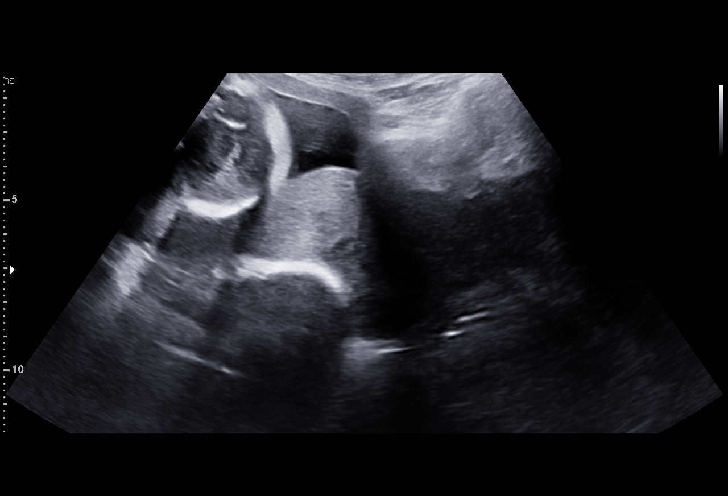
[im 11/95]
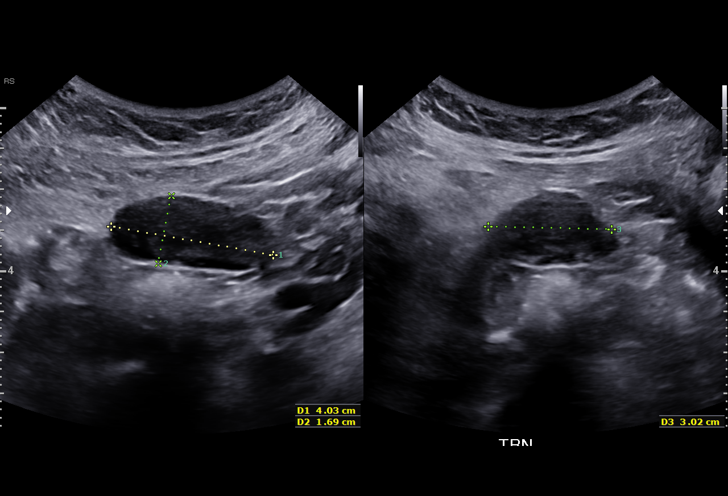
[im 18/95]
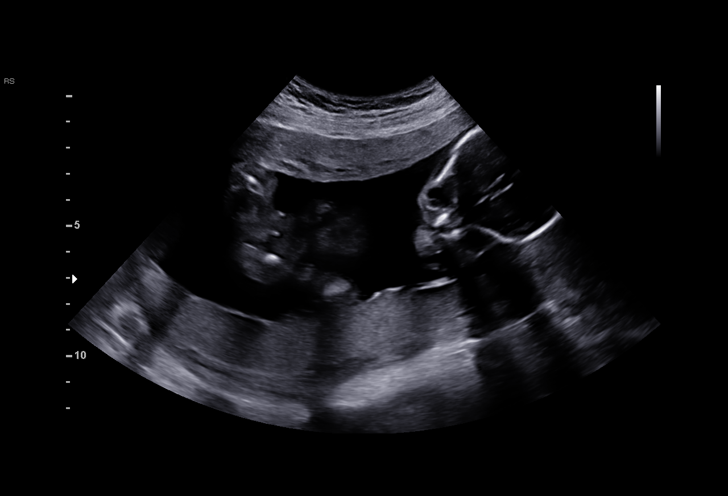
[im 25/95]
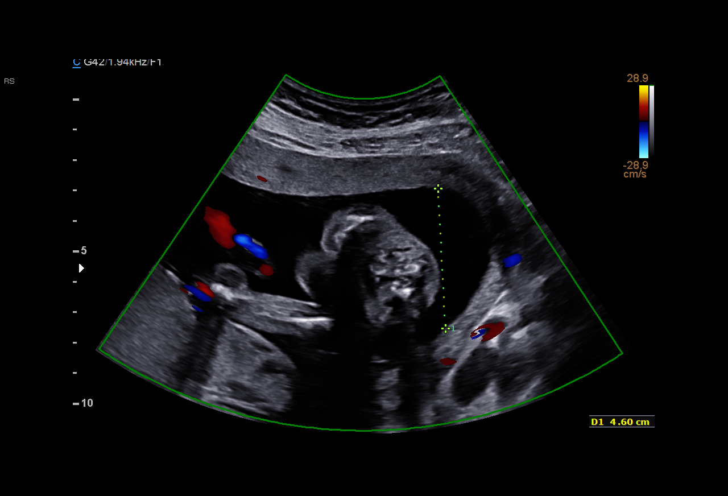
[im 32/95]
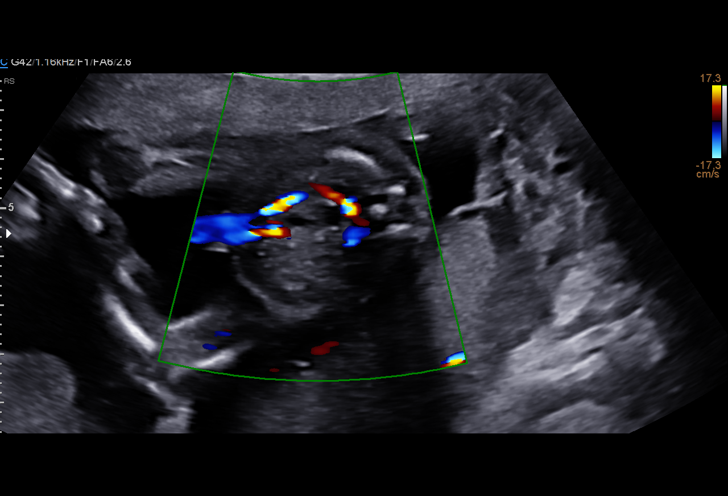
[im 39/95]
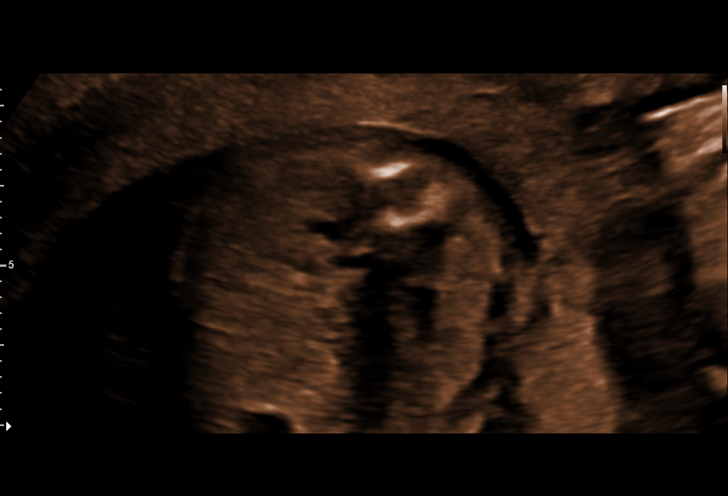
[im 46/95]
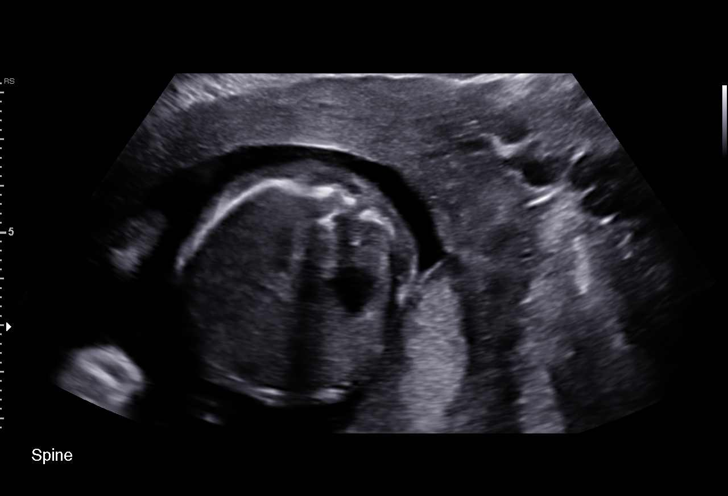
[im 53/95]
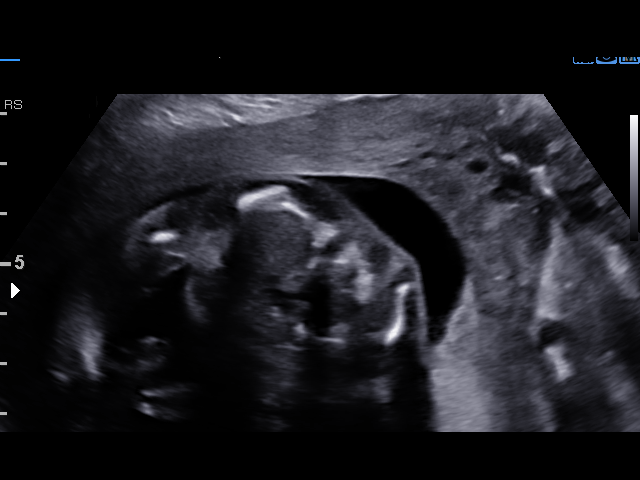
[im 60/95]
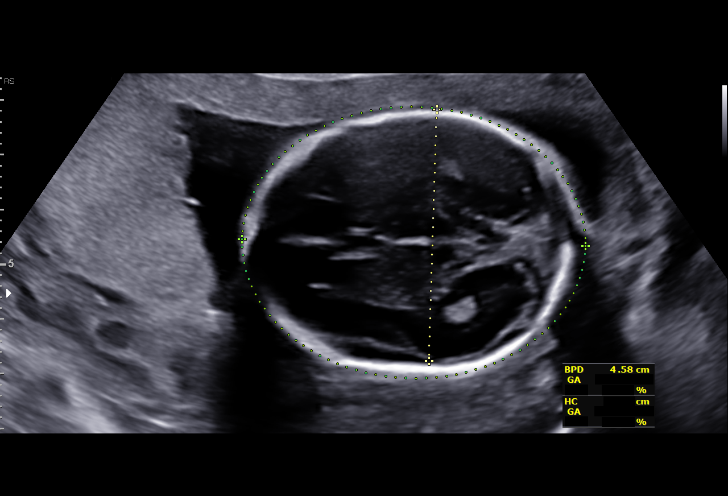
[im 67/95]
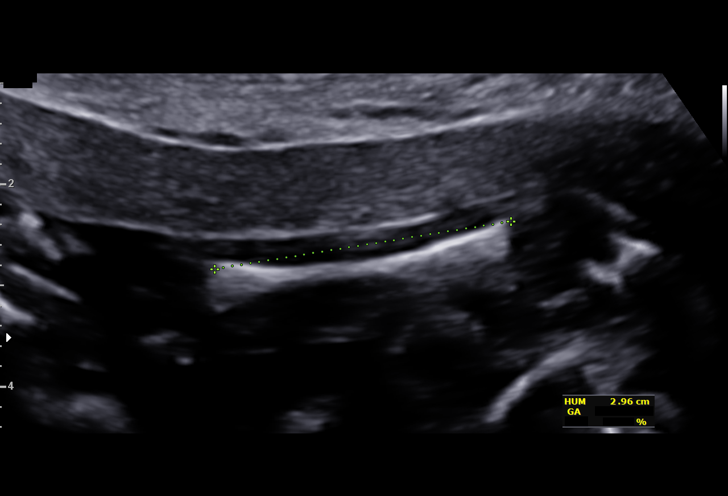
[im 74/95]
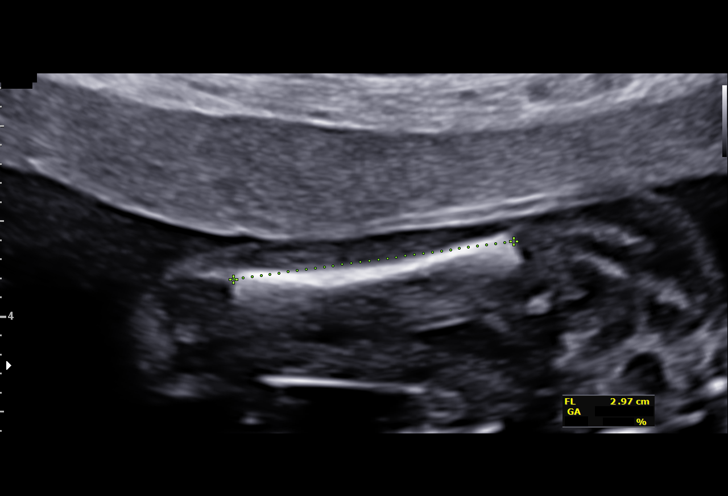
[im 81/95]
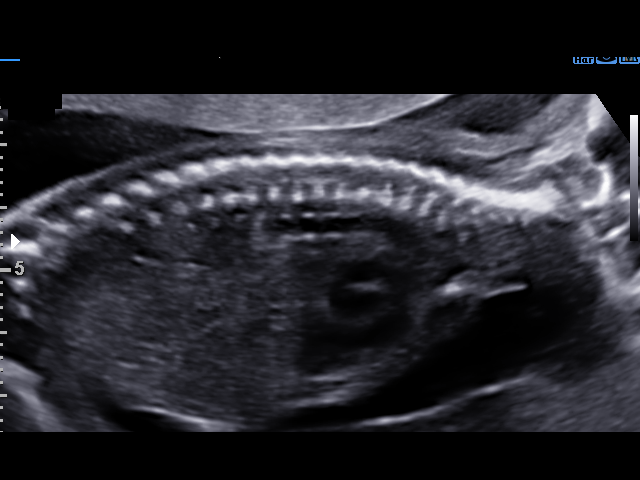
[im 88/95]
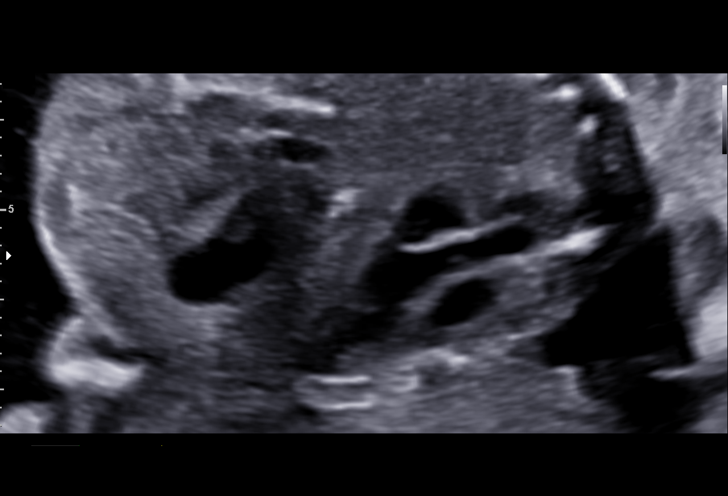
[im 95/95]
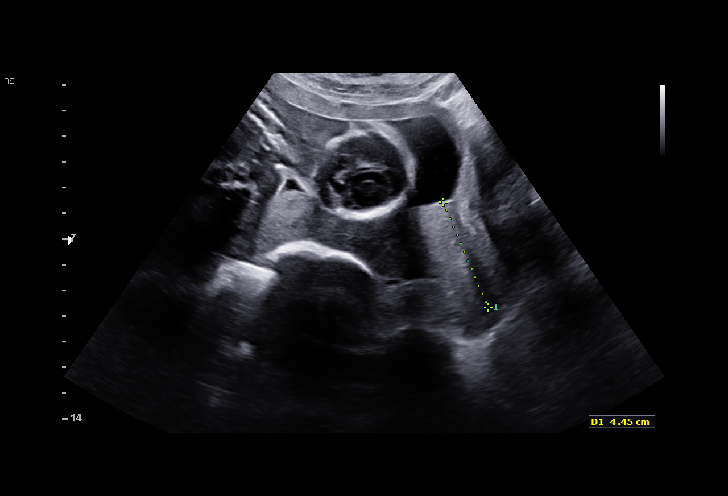

[14 of 28 positions shown; findings below may reference images not displayed]

Road [HOSPITAL]

Indications

19 weeks gestation of pregnancy
Encounter for antenatal screening for
malformations
History of cesarean delivery, currently
pregnant X 1
OB History

Gravidity:    5         Term:   2         SAB:   1
TOP:          1
Fetal Evaluation

Num Of Fetuses:     1
Fetal Heart         157
Rate(bpm):
Cardiac Activity:   Observed
Presentation:       Cephalic
Placenta:           Posterior, above cervical os
P. Cord Insertion:  Visualized, central

Amniotic Fluid
AFI FV:      Subjectively within normal limits

Largest Pocket(cm)
4.60
Biometry

BPD:      46.4  mm     G. Age:  20w 0d         75  %    CI:        69.08   %    70 - 86
FL/HC:      16.8   %    16.1 -
HC:      178.3  mm     G. Age:  20w 2d         80  %    HC/AC:      1.13        1.09 -
AC:       158   mm     G. Age:  20w 6d         88  %    FL/BPD:     64.4   %
FL:       29.9  mm     G. Age:  19w 2d         36  %    FL/AC:      18.9   %    20 - 24
HUM:      29.3  mm     G. Age:  19w 4d         55  %
CER:      22.2  mm     G. Age:  21w 1d         83  %
NFT:       5.2  mm

CM:        3.1  mm

Est. FW:     336  gm    0 lb 12 oz      57  %
Gestational Age

LMP:           20w 3d        Date:  03/30/17                 EDD:   01/04/18
U/S Today:     20w 1d                                        EDD:   01/06/18
Best:          19w 3d     Det. By:  Early Ultrasound         EDD:   01/11/18
(06/11/17)
Anatomy

Cranium:               Appears normal         Aortic Arch:            Appears normal
Cavum:                 Appears normal         Ductal Arch:            Appears normal
Ventricles:            Appears normal         Diaphragm:              Appears normal
Choroid Plexus:        Appears normal         Stomach:                Appears normal, left
sided
Cerebellum:            Appears normal         Abdomen:                Appears normal
Posterior Fossa:       Appears normal         Abdominal Wall:         Appears nml (cord
insert, abd wall)
Nuchal Fold:           Appears normal         Cord Vessels:           Appears normal (3
vessel cord)
Face:                  Profile nl; orbits not Kidneys:                Appear normal
well visualized
Lips:                  Appears normal         Bladder:                Appears normal
Thoracic:              Appears normal         Spine:                  Appears normal
Heart:                 Not well visualized    Upper Extremities:      Appears normal
RVOT:                  Not well visualized    Lower Extremities:      Appears normal
LVOT:                  Appears normal

Other:  Male gender. Heels and left 5th digit previously seen. Technically
difficult due to fetal position.
Cervix Uterus Adnexa

Cervix
Length:           4.45  cm.
Normal appearance by transabdominal scan.

Uterus
No abnormality visualized.

Left Ovary
Within normal limits.

Right Ovary
Within normal limits.

Adnexa:       No abnormality visualized. No adnexal mass
visualized.
Impression

SIUP at 19+3 weeks
Normal detailed fetal anatomy; limited views of orbits, 4-
chamber and RVOT
Markers of aneuploidy: none
Normal amniotic fluid volume
Measurements consistent with prior US
Recommendations

Follow-up ultrasound in 4-6 weeks to complete anatomy
survey

## 2020-07-15 IMAGING — US US MFM FETAL BPP W/O NON-STRESS
1 series · 11 of 11 positions shown · non-contrast
Comparison: none

[Series 1: us mfm fetal bpp w/o non-stress · 11 acquisitions, 11 frames shown]
[im 1/11]
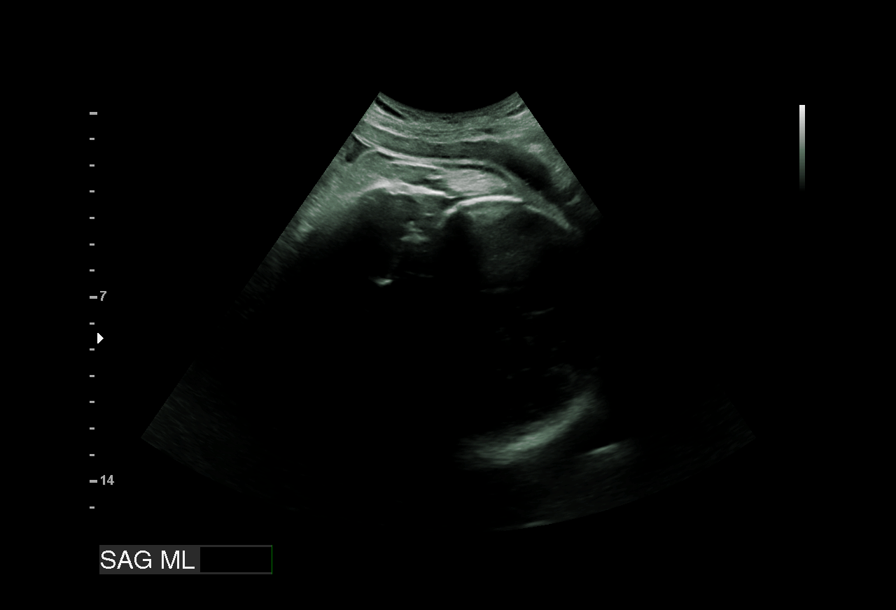
[im 2/11]
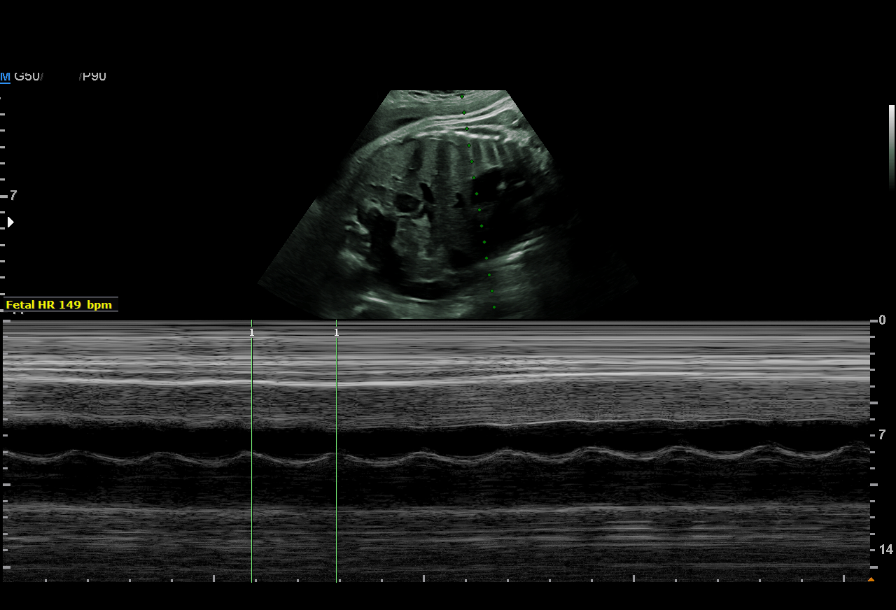
[im 3/11]
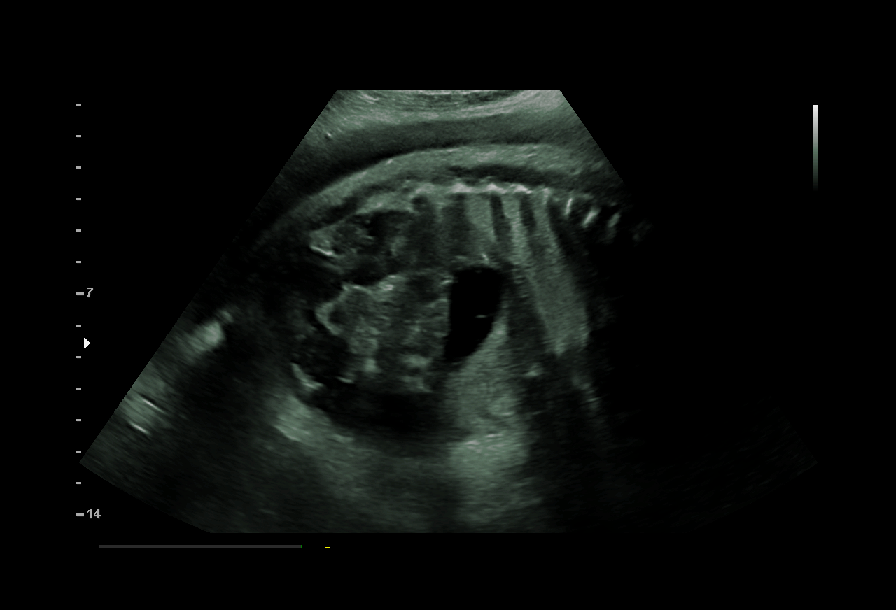
[im 4/11]
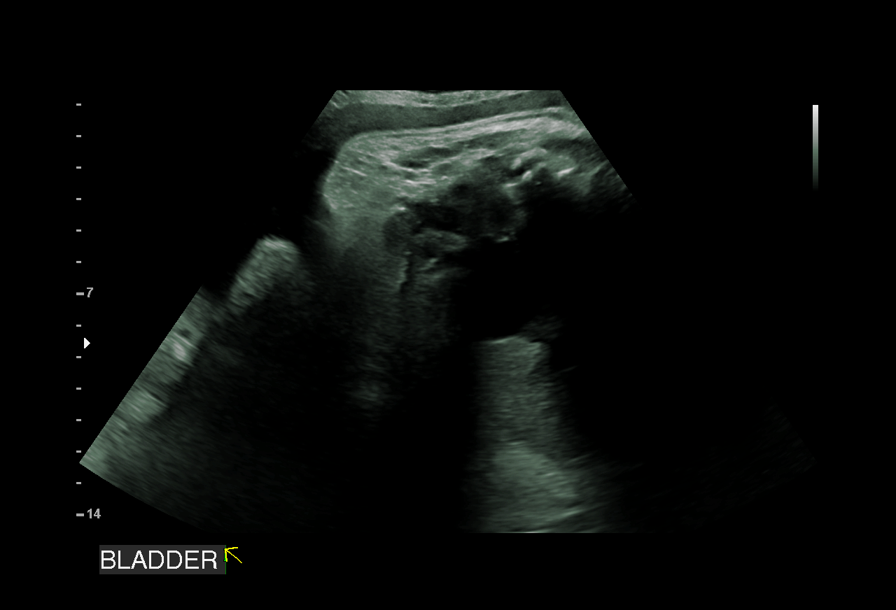
[im 5/11]
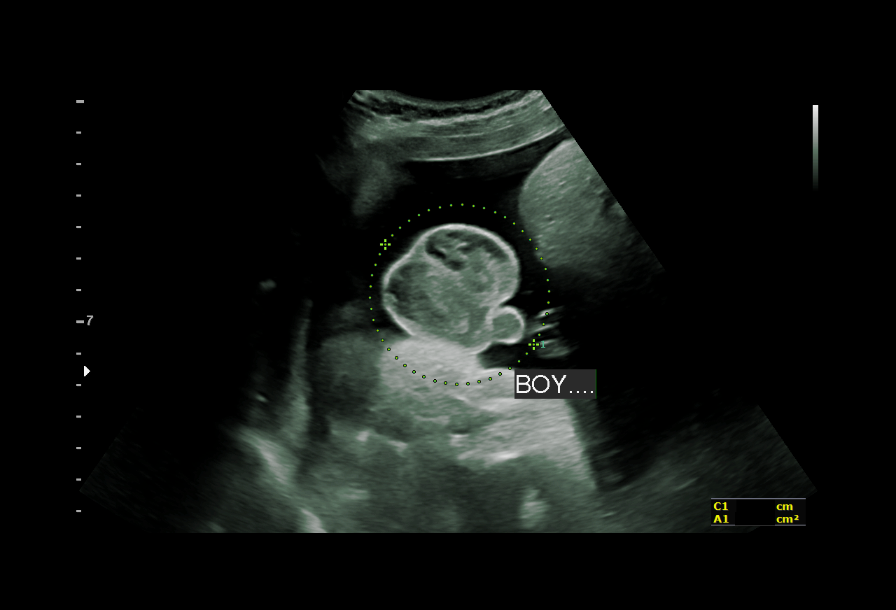
[im 6/11]
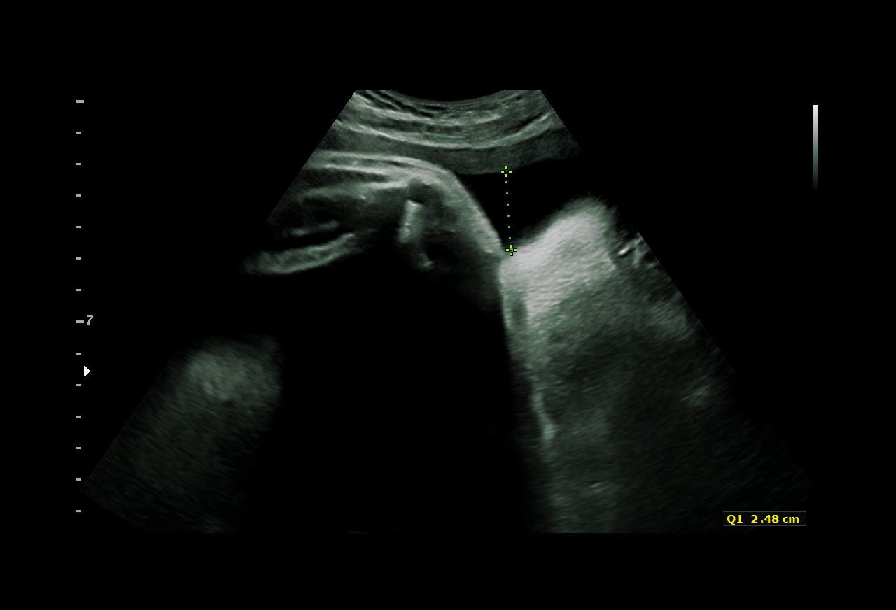
[im 7/11]
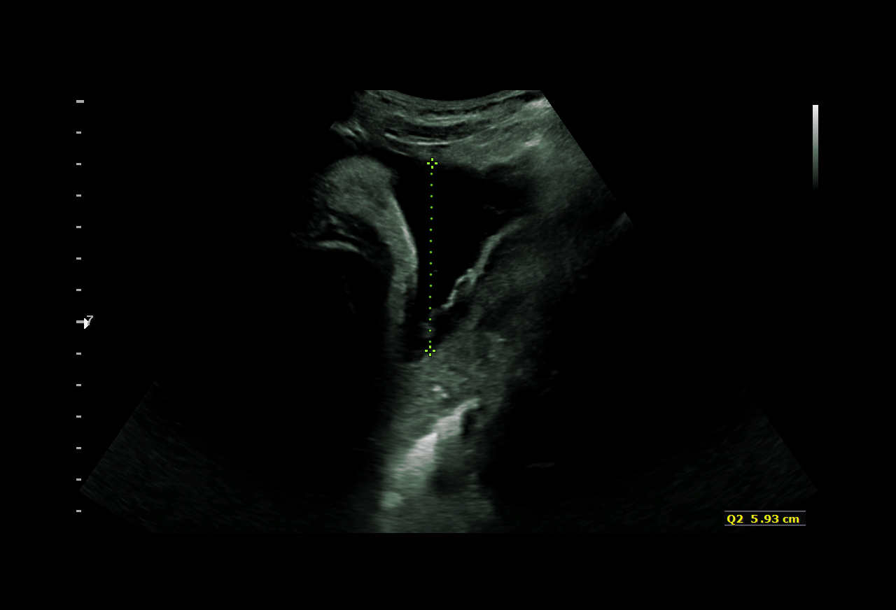
[im 8/11]
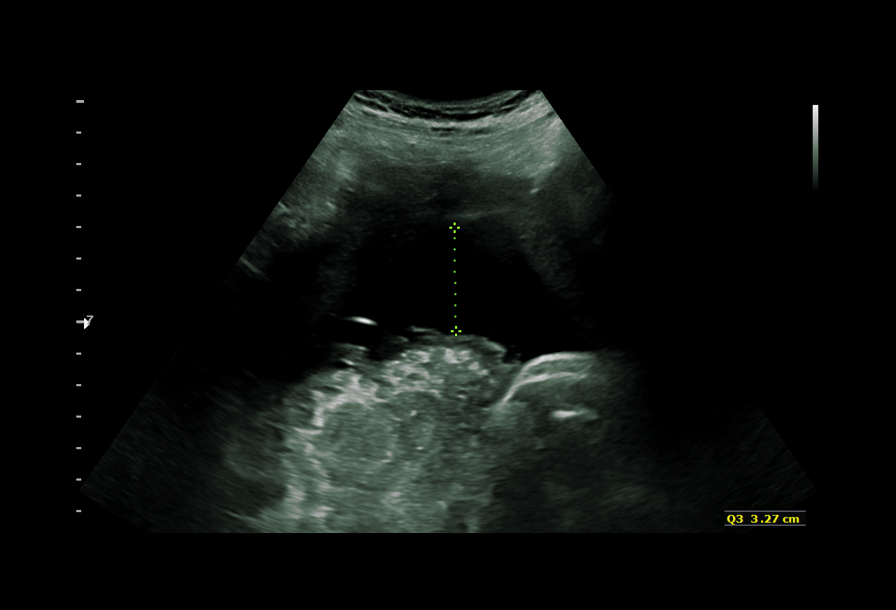
[im 9/11]
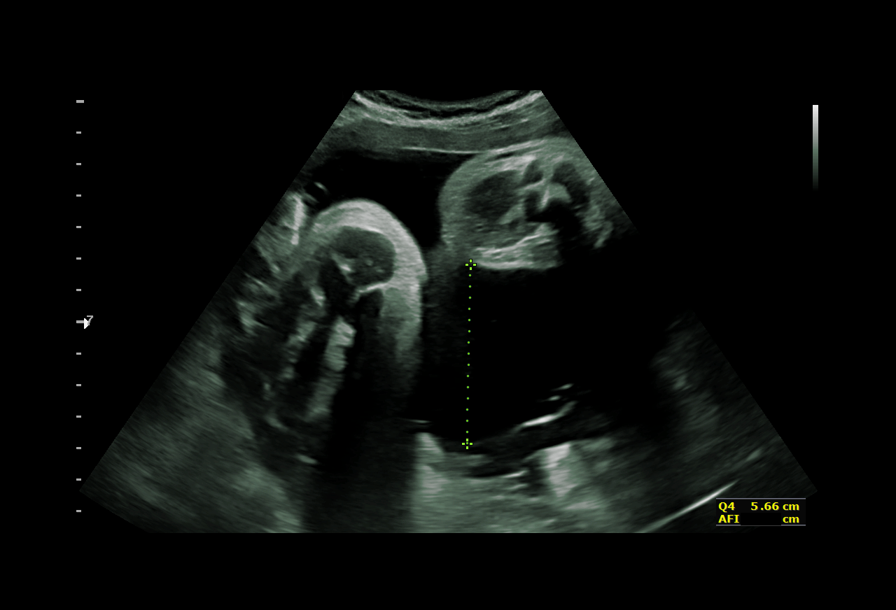
[im 10/11]
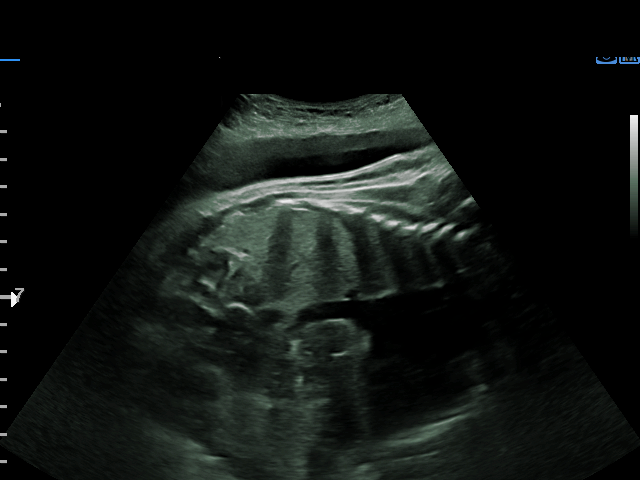
[im 11/11]
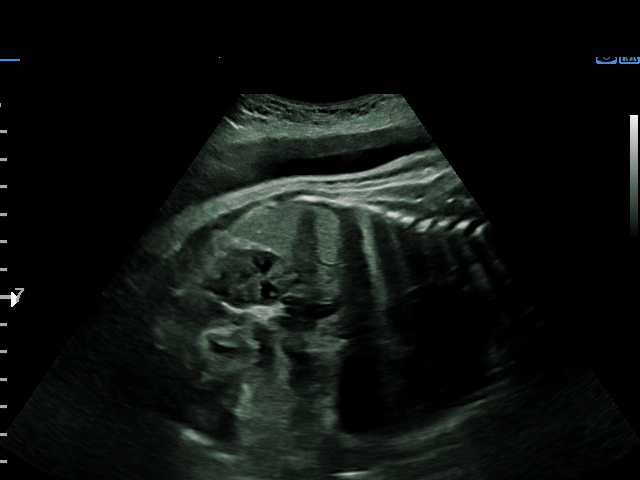

[11 of 11 positions shown; findings below may reference images not displayed]

Road [HOSPITAL]

Indications

Fetal heart rate decelerations affecting
management of mother
37 weeks gestation of pregnancy
History of cesarean delivery, currently
pregnant X 1
Fetal Evaluation

Num Of Fetuses:         1
Fetal Heart Rate(bpm):  149
Cardiac Activity:       Observed
Presentation:           Cephalic

Amniotic Fluid
AFI FV:      Within normal limits

AFI Sum(cm)     %Tile       Largest Pocket(cm)
17.34           66

RUQ(cm)       RLQ(cm)       LUQ(cm)        LLQ(cm)
2.48
Biophysical Evaluation

Amniotic F.V:   Within normal limits       F. Tone:        Observed
F. Movement:    Observed                   Score:          [DATE]
F. Breathing:   Observed
OB History

Gravidity:    5         Term:   2         SAB:   1
TOP:          1
Gestational Age

LMP:           38w 2d        Date:  03/30/17                 EDD:   01/04/18
Best:          37w 2d     Det. By:  Early Ultrasound         EDD:   01/11/18
(06/11/17)
Anatomy

Stomach:               Appears normal, left   Bladder:                Appears normal
sided
Comments

U/S images reviewed.  No evidence of fetal compromise is
found on BPP today.  No fetal abnormalities are seen.
Recommendations: 1) Follow-up as clinically indicated

Recommendations

1) Follow-up as clinically indicated

## 2022-03-29 IMAGING — MG DIGITAL DIAGNOSTIC BILAT W/ TOMO W/ CAD
8 series · 8 of 24 positions shown · non-contrast
Comparison: None.

CLINICAL DATA: Patient's referring clinician reports lumps at the 3
and 6 o'clock positions of the right breast. The patient has a
family history of breast carcinoma, mother diagnosed in her 40s.

EXAM:
DIGITAL DIAGNOSTIC BILATERAL MAMMOGRAM WITH CAD AND TOMO
ULTRASOUND RIGHT BREAST

[R CC synth-2D]
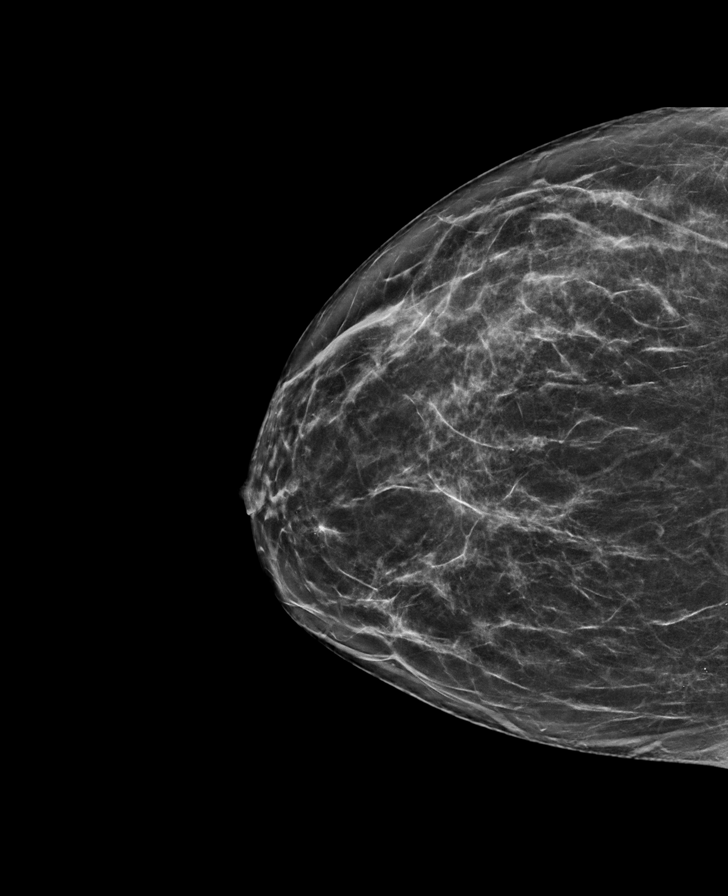

[L CC synth-2D]
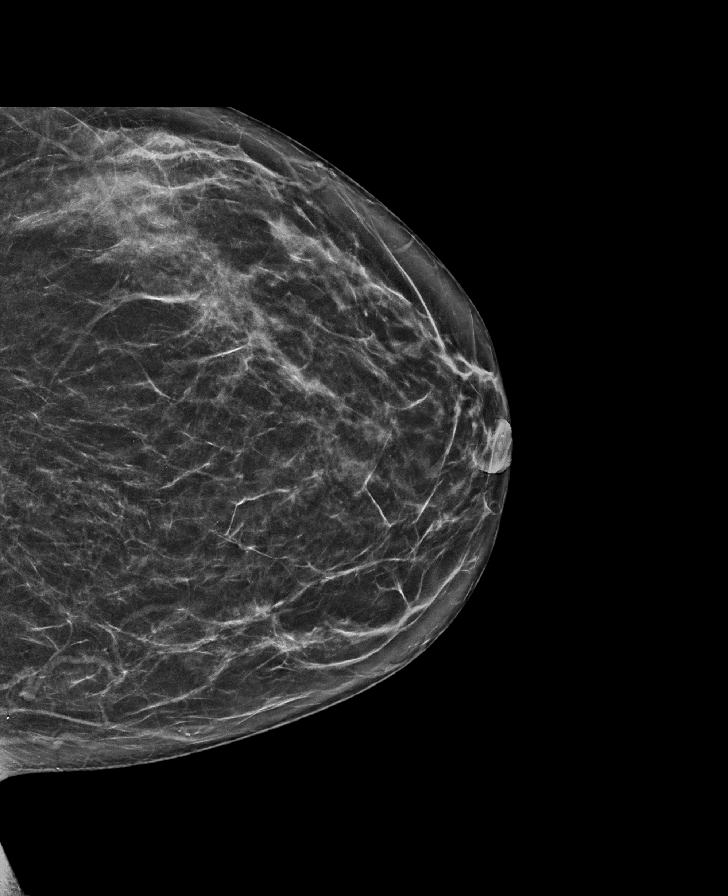

[R MLO synth-2D]
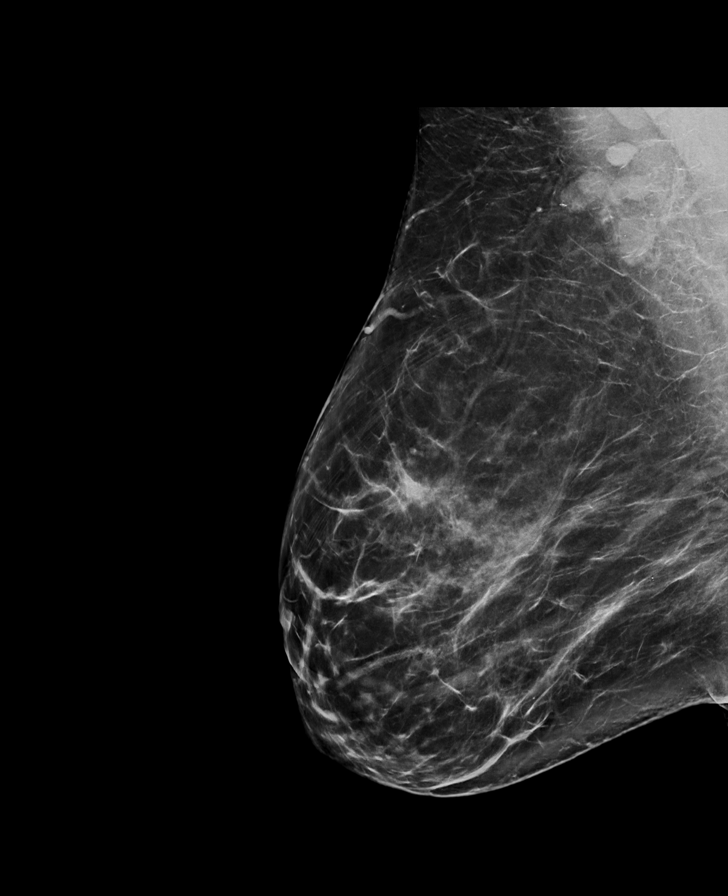

[L MLO synth-2D]
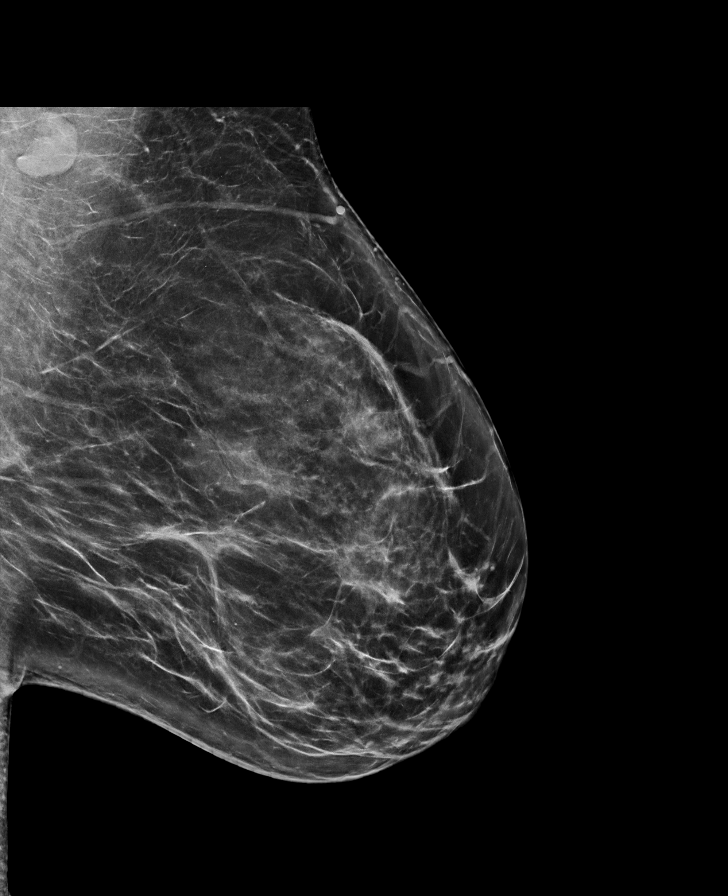

[R MLO tomo · tomo slice 47/94.0]
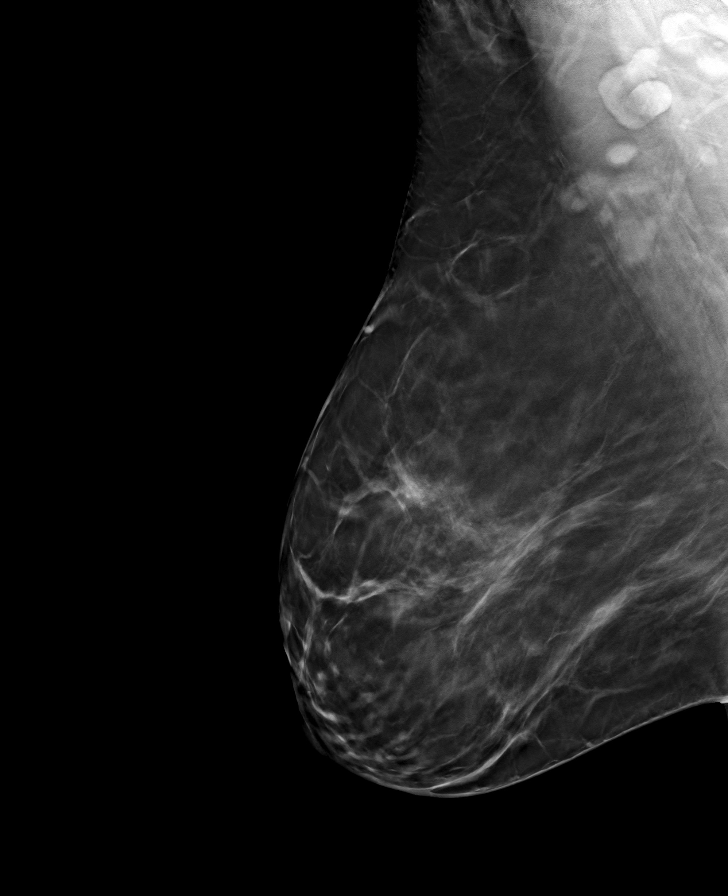

[R CC tomo · tomo slice 35/70.0]
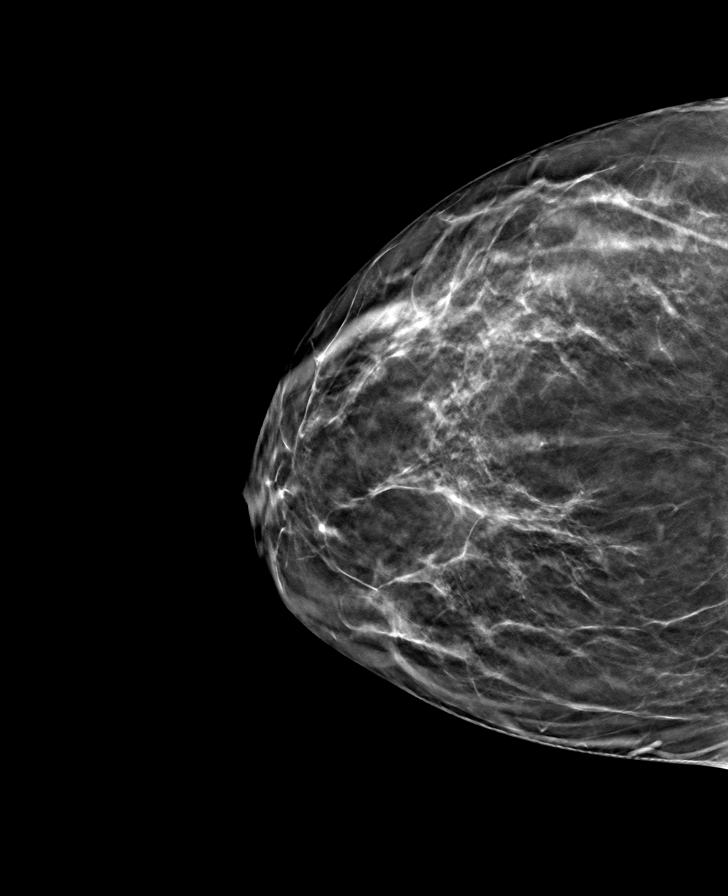

[L MLO tomo · tomo slice 46/91.0]
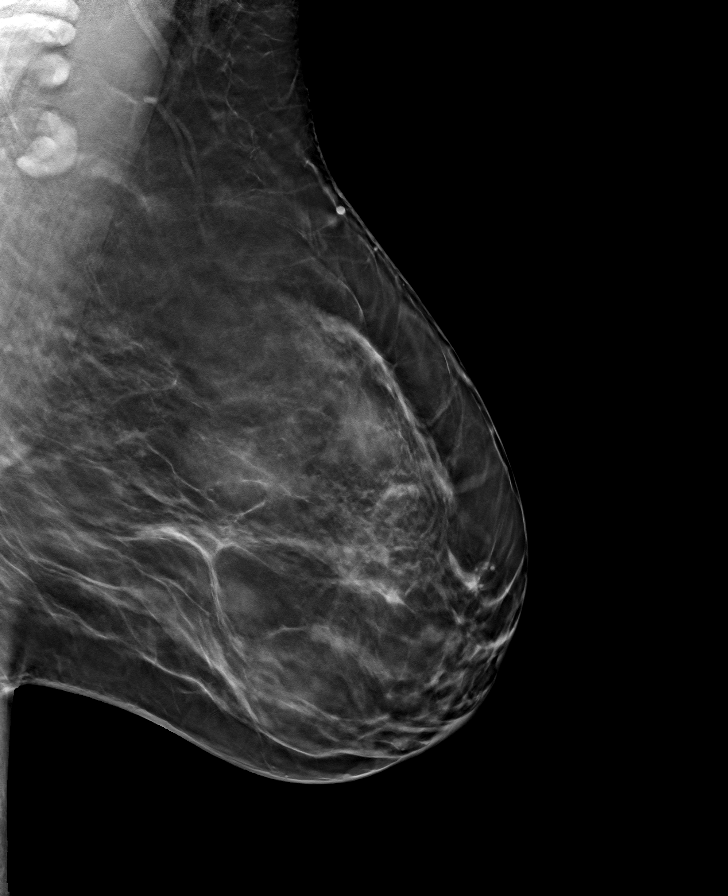

[L CC tomo · tomo slice 35/69.0]
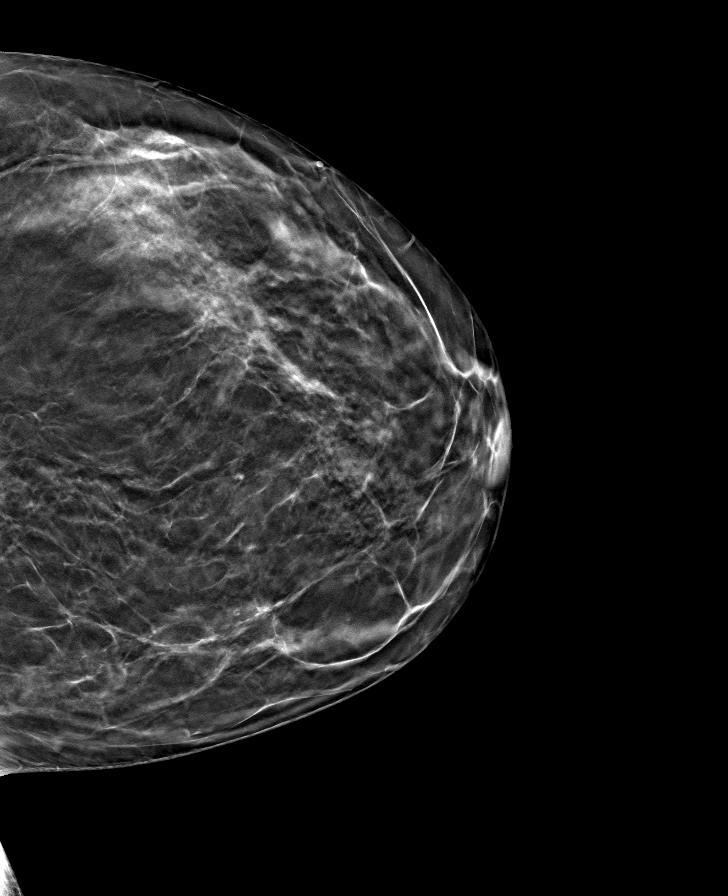

[8 of 24 positions shown; findings below may reference images not displayed]

ACR Breast Density Category b: There are scattered areas of
fibroglandular density.
FINDINGS: There are no discrete masses, areas of architectural distortion,
areas of significant asymmetry or suspicious calcifications.

Mammographic images were processed with CAD.

On physical exam, there is a nodular texture to the fibroglandular
tissue of the right breast most evident medially. No
defined/discrete mass.

Targeted ultrasound is performed, showing normal fibroglandular
tissue in the 3 and 6 o'clock positions of the right breast. No
masses.
IMPRESSION: 1. No evidence of breast malignancy.

RECOMMENDATION:
Screening mammogram in one year.(Code:WF-[id number]) Recommend beginning
screening mammography at this time since the patient's mother was
diagnosed with breast carcinoma in her 40s.

I have discussed the findings and recommendations with the patient.
If applicable, a reminder letter will be sent to the patient
regarding the next appointment.

BI-RADS CATEGORY  1: Negative.
